# Patient Record
Sex: Female | Born: 1975 | Race: Black or African American | Hispanic: No | Smoking: Former smoker
Health system: Southern US, Community
[De-identification: ages and names within clinical notes are randomized; demographics above are authoritative.]

## PROBLEM LIST (undated history)

## (undated) DIAGNOSIS — N83209 Unspecified ovarian cyst, unspecified side: Secondary | ICD-10-CM

## (undated) DIAGNOSIS — A749 Chlamydial infection, unspecified: Secondary | ICD-10-CM

## (undated) DIAGNOSIS — M1990 Unspecified osteoarthritis, unspecified site: Secondary | ICD-10-CM

## (undated) DIAGNOSIS — A549 Gonococcal infection, unspecified: Secondary | ICD-10-CM

## (undated) DIAGNOSIS — E785 Hyperlipidemia, unspecified: Secondary | ICD-10-CM

## (undated) DIAGNOSIS — R600 Localized edema: Secondary | ICD-10-CM

## (undated) DIAGNOSIS — I10 Essential (primary) hypertension: Secondary | ICD-10-CM

## (undated) HISTORY — DX: Unspecified ovarian cyst, unspecified side: N83.209

## (undated) HISTORY — PX: BREAST SURGERY: SHX581

## (undated) HISTORY — DX: Chlamydial infection, unspecified: A74.9

## (undated) HISTORY — DX: Gonococcal infection, unspecified: A54.9

## (undated) HISTORY — DX: Unspecified osteoarthritis, unspecified site: M19.90

## (undated) HISTORY — DX: Morbid (severe) obesity due to excess calories: E66.01

## (undated) HISTORY — DX: Essential (primary) hypertension: I10

## (undated) HISTORY — DX: Hyperlipidemia, unspecified: E78.5

## (undated) HISTORY — PX: FOOT SURGERY: SHX648

---
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
## 2012-12-01 ENCOUNTER — Encounter: Payer: Self-pay | Admitting: Obstetrics & Gynecology

## 2012-12-01 ENCOUNTER — Ambulatory Visit (INDEPENDENT_AMBULATORY_CARE_PROVIDER_SITE_OTHER): Payer: BC Managed Care – PPO | Admitting: Obstetrics & Gynecology

## 2012-12-01 VITALS — BP 128/84 | HR 90 | Resp 16 | Ht 67.0 in | Wt 303.0 lb

## 2012-12-01 DIAGNOSIS — T8369XA Infection and inflammatory reaction due to other prosthetic device, implant and graft in genital tract, initial encounter: Secondary | ICD-10-CM

## 2012-12-01 DIAGNOSIS — Z30430 Encounter for insertion of intrauterine contraceptive device: Secondary | ICD-10-CM

## 2012-12-01 DIAGNOSIS — Z0000 Encounter for general adult medical examination without abnormal findings: Secondary | ICD-10-CM

## 2012-12-01 DIAGNOSIS — Z01812 Encounter for preprocedural laboratory examination: Secondary | ICD-10-CM

## 2012-12-01 LAB — POCT URINE PREGNANCY: Preg Test, Ur: NEGATIVE

## 2012-12-01 MED ORDER — LEVONORGESTREL 13.5 MG IU IUD
1.0000 | INTRAUTERINE_SYSTEM | Freq: Once | INTRAUTERINE | Status: AC
  Administered 2012-12-01: 1 via INTRAUTERINE

## 2012-12-01 NOTE — Progress Notes (Signed)
  Subjective:    Patient ID: Caitlin Bishop, female    DOB: 07/09/1975, 37 y.o.   MRN: [id number]  HPI  37 yo lady who is planning to get weight loss surgery in the near future. She is here for a Mirena insertion because she wants to ensure contraception for at least 18 months.  Review of Systems She reports a normal pap smear last month.    Objective:   Physical Exam  UPT negative, consent signed, Time out procedure done. Cervix prepped with betadine and grasped with a single tooth tenaculum. Her os was noted to be nulliparous. I attempted to place the Mirena, but it would not pass the os. I therefore used a yellow and then white plastic dilators. They did pass the os, but the Mirena would still not pass the os. I therefore offered her a Skyla. She accepted. Caitlin Bishop was easily placed and the strings were cut to 3-4 cm. Uterus sounded to 8 cm. She tolerated the procedure well.       Assessment & Plan:  Contraception- Skyla RTC 1 month for a string check

## 2013-01-13 ENCOUNTER — Encounter: Payer: Self-pay | Admitting: Obstetrics & Gynecology

## 2013-01-13 ENCOUNTER — Ambulatory Visit (INDEPENDENT_AMBULATORY_CARE_PROVIDER_SITE_OTHER): Payer: BC Managed Care – PPO | Admitting: Obstetrics & Gynecology

## 2013-01-13 VITALS — BP 122/86 | HR 91 | Resp 16 | Ht 67.0 in | Wt 302.0 lb

## 2013-01-13 DIAGNOSIS — Z30431 Encounter for routine checking of intrauterine contraceptive device: Secondary | ICD-10-CM

## 2013-01-13 NOTE — Progress Notes (Signed)
  Subjective:    Patient ID: Caitlin Bishop, female    DOB: 01/15/1976, 37 y.o.   MRN: [id number]  HPI  She is here for a string check after having a Skyla in for the last month. She had a period that was much lighter and much less painful than usual. She is very happy. 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  Review of Systems     Objective:   Physical Exam  String seen      Assessment & Plan:  [name] for contraception RTC 1 year for annual

## 2013-01-13 NOTE — Patient Instructions (Signed)

## 2013-06-07 ENCOUNTER — Encounter: Payer: Self-pay | Admitting: Emergency Medicine

## 2013-06-07 ENCOUNTER — Emergency Department (INDEPENDENT_AMBULATORY_CARE_PROVIDER_SITE_OTHER)
Admission: EM | Admit: 2013-06-07 | Discharge: 2013-06-07 | Disposition: A | Discharge status: Home / Self Care | Payer: BC Managed Care – PPO | Source: Home / Self Care | Attending: Family Medicine | Admitting: Family Medicine

## 2013-06-07 DIAGNOSIS — J029 Acute pharyngitis, unspecified: Secondary | ICD-10-CM

## 2013-06-07 HISTORY — DX: Localized edema: R60.0

## 2013-06-07 LAB — POCT RAPID STREP A (OFFICE): RAPID STREP A SCREEN: NEGATIVE

## 2013-06-07 NOTE — ED Notes (Signed)
Pt c/o sore throat and sinus pressure x last night. Denies fever.

## 2013-06-07 NOTE — ED Provider Notes (Signed)
CSN: [id number]     Arrival date & time 06/07/13  1209 History   First MD Initiated Contact with Patient 06/07/13 1256     Chief Complaint  Patient presents with  . Sore Throat          HPI Comments: Last night patient developed a mild headache, fatigue, sneezing, and sore throat.  This morning she had sinus congestion and post-nasal drainage.  No cough.  No fevers, chills, and sweats   The history is provided by the patient.    Past Medical History  Diagnosis Date  . Arthritis   . Hyperlipidemia   . Hypertension   . Chlamydia   . Gonorrhea   . Other and unspecified ovarian cysts   . Morbid obesity   . Edema of lower extremity    Past Surgical History  Procedure Laterality Date  . Intestional  06/25/77    Hole in intestine  . Foot surgery      removal of sewing  . Breast surgery     Family History  Problem Relation Age of Onset  . Heart attack Brother   . Hypertension Mother   . Dementia Mother   . Parkinson's disease Mother   . Hyperlipidemia Mother   . Hypertension Father   . Hyperlipidemia Father   . Hypertension Brother   . Hyperlipidemia Brother   . Diabetes Brother    History  Substance Use Topics  . Smoking status: Current Every Day Smoker -- 0.50 packs/day for 13 years    Types: Cigarettes  . Smokeless tobacco: Never Used     Comment: Now smoking E-cigarettes  . Alcohol Use: No   OB History   Grav Para Term Preterm Abortions TAB SAB Ect Mult Living   1 0 0  1  1   0     Review of Systems + sore throat No cough No pleuritic pain No wheezing + nasal congestion + post-nasal drainage + sinus pain/pressure No itchy/red eyes No earache No hemoptysis No SOB No fever/chills No nausea No vomiting No abdominal pain No diarrhea No urinary symptoms No skin rash + fatigue No myalgias No headache Used OTC meds without relief    Allergies  Review of patient's allergies indicates no known allergies.  Home Medications   Current  Outpatient Rx  Name  Route  Sig  Dispense  Refill  . cyclobenzaprine (FLEXERIL) 10 MG tablet   Oral   Take 10 mg by mouth at bedtime as needed for muscle spasms.         . diclofenac (VOLTAREN) 75 MG EC tablet   Oral   Take 75 mg by mouth 2 (two) times daily.         . furosemide (LASIX) 20 MG tablet               . phentermine 37.5 MG capsule   Oral   Take 37.5 mg by mouth every morning.         . traMADol-acetaminophen (ULTRACET) 37.5-325 MG per tablet   Oral   Take 1 tablet by mouth every 8 (eight) hours as needed for pain.         . Vitamin D, Ergocalciferol, (DRISDOL) 50000 UNITS CAPS capsule                BP 110/77  Pulse 109  Temp(Src) 98.9 F (37.2 C) (Oral)  Resp 18  Ht 5' 7.5" (1.715 m)  Wt 295 lb (133.811 kg)  BMI 45.49 kg/m2  SpO2 99%  LMP 06/04/2013 Physical Exam Nursing notes and Vital Signs reviewed. Appearance:  Patient appears stated age, and in no acute distress.  Patient is obese (BMI 45.5) Eyes:  Pupils are equal, round, and reactive to light and accomodation.  Extraocular movement is intact.  Conjunctivae are not inflamed  Ears:  Canals normal.  Tympanic membranes normal.  Nose:  Mildly congested turbinates.  No sinus tenderness.   Pharynx:  Normal Neck:  Supple.   Nontender enlarged posterior nodes are palpated bilaterally  Lungs:  Clear to auscultation.  Breath sounds are equal.  Heart:  Regular rate and rhythm without murmurs, rubs, or gallops.  Abdomen:  Nontender without masses or hepatosplenomegaly.  Bowel sounds are present.  No CVA or flank tenderness.  Extremities:  No edema.  No calf tenderness Skin:  No rash present.    ED Course  Procedures  none    Labs Reviewed  STREP A DNA PROBE  POCT RAPID STREP A (OFFICE) negative         MDM   Final diagnoses:  Acute pharyngitis; suspect early viral URI   Throat culture pending There is no evidence of bacterial infection today.  Treat symptomatically for now: If  increasing cold symptoms develop, begin the following: Take plain Mucinex (1200 mg guaifenesin) twice daily for cough and congestion.  May add Sudafed for sinus congestion.   Increase fluid intake, rest. May use Afrin nasal spray (or generic oxymetazoline) twice daily for about 5 days.  Also recommend using saline nasal spray several times daily and saline nasal irrigation (AYR is a common brand) Try warm salt water gargles for sore throat.  Stop all antihistamines for now, and other non-prescription cough/cold preparations. May take Ibuprofen 200mg , 4 tabs every 8 hours with food for sore throat. May take Delsym Cough Suppressant at bedtime for nighttime cough.  Follow-up with family doctor if not improving 7 to 10 days.      Lattie Haw, MD 06/09/13 [phone]

## 2013-06-07 NOTE — Discharge Instructions (Signed)
If increasing cold symptoms develop, begin the following: Take plain Mucinex (1200 mg guaifenesin) twice daily for cough and congestion.  May add Sudafed for sinus congestion.   Increase fluid intake, rest. May use Afrin nasal spray (or generic oxymetazoline) twice daily for about 5 days.  Also recommend using saline nasal spray several times daily and saline nasal irrigation (AYR is a common brand) Try warm salt water gargles for sore throat.  Stop all antihistamines for now, and other non-prescription cough/cold preparations. May take Ibuprofen 200mg , 4 tabs every 8 hours with food for sore throat. May take Delsym Cough Suppressant at bedtime for nighttime cough.  Follow-up with family doctor if not improving 7 to 10 days.

## 2013-06-08 LAB — STREP A DNA PROBE: GASP: NEGATIVE

## 2013-06-11 ENCOUNTER — Telehealth: Payer: Self-pay | Admitting: *Deleted

## 2014-02-14 ENCOUNTER — Encounter: Payer: Self-pay | Admitting: Emergency Medicine

## 2014-12-02 ENCOUNTER — Ambulatory Visit (INDEPENDENT_AMBULATORY_CARE_PROVIDER_SITE_OTHER): Payer: BLUE CROSS/BLUE SHIELD | Admitting: Advanced Practice Midwife

## 2014-12-02 ENCOUNTER — Encounter: Payer: Self-pay | Admitting: Advanced Practice Midwife

## 2014-12-02 VITALS — BP 116/83 | HR 78 | Resp 16 | Ht 67.0 in | Wt 290.0 lb

## 2014-12-02 DIAGNOSIS — N898 Other specified noninflammatory disorders of vagina: Secondary | ICD-10-CM

## 2014-12-02 DIAGNOSIS — E010 Iodine-deficiency related diffuse (endemic) goiter: Secondary | ICD-10-CM

## 2014-12-02 DIAGNOSIS — Z01419 Encounter for gynecological examination (general) (routine) without abnormal findings: Secondary | ICD-10-CM | POA: Diagnosis not present

## 2014-12-02 DIAGNOSIS — Z124 Encounter for screening for malignant neoplasm of cervix: Secondary | ICD-10-CM | POA: Diagnosis not present

## 2014-12-02 DIAGNOSIS — Z1151 Encounter for screening for human papillomavirus (HPV): Secondary | ICD-10-CM

## 2014-12-02 DIAGNOSIS — Z113 Encounter for screening for infections with a predominantly sexual mode of transmission: Secondary | ICD-10-CM

## 2014-12-02 DIAGNOSIS — Z30431 Encounter for routine checking of intrauterine contraceptive device: Secondary | ICD-10-CM

## 2014-12-02 DIAGNOSIS — E049 Nontoxic goiter, unspecified: Secondary | ICD-10-CM

## 2014-12-02 NOTE — Patient Instructions (Signed)
Infertility WHAT IS INFERTILITY?  Infertility is usually defined as not being able to get pregnant after trying for one year of regular sexual intercourse without the use of contraceptives. Or not being able to carry a pregnancy to term and have a baby. The infertility rate in the [place] is around 10%. Pregnancy is the result of a chain of events. A woman must release an egg from one of her ovaries (ovulation). The egg must be fertilized by the female sperm. Then it travels through a fallopian tube into the uterus (womb), where it attaches to the wall of the uterus and grows. A man must have enough sperm, and the sperm must join with (fertilize) the egg along the way, at the proper time. The fertilized egg must then become attached to the inside of the uterus. While this may seem simple, many things can happen to prevent pregnancy from occurring.  WHOSE PROBLEM IS IT?  About 20% of infertility cases are due to problems with the man (female factors) and 65% are due to problems with the woman (female factors). Other cases are due to a combination of female and female factors or to unknown causes.  WHAT CAUSES INFERTILITY IN MEN?  Infertility in men is often caused by problems with making enough normal sperm or getting the sperm to reach the egg. Problems with sperm may exist from birth or develop later in life, due to illness or injury. Some men produce no sperm, or produce too few sperm (oligospermia). Other problems include:  Sexual dysfunction.  Hormonal or endocrine problems.  Age. Female fertility decreases with age, but not at as young an age as female fertility.  Infection.  Congenital problems. Birth defect, such as absence of the tubes that carry the sperm (vas deferens).  Genetic/chromosomal problems.  Antisperm antibody problems.  Retrograde ejaculation (sperm go into the bladder).  Varicoceles, spematoceles, or tumors of the testicles.  Lifestyle can influence the number and  quality of a man's sperm.  Alcohol and drugs can temporarily reduce sperm quality.  Environmental toxins, including pesticides and lead, may cause some cases of infertility in men. WHAT CAUSES INFERTILITY IN WOMEN?   Problems with ovulation account for most infertility in women. Without ovulation, eggs are not available to be fertilized.  Signs of problems with ovulation include irregular menstrual periods or no periods at all.  Simple lifestyle factors, including stress, diet, or athletic training, can affect a woman's hormonal balance.  Age. Fertility begins to decrease in women in the early 76s and is worse after age 71.  Much less often, a hormonal imbalance from a serious medical problem, such as a pituitary gland tumor, thyroid or other chronic medical disease, can cause ovulation problems.  Pelvic infections.  Polycystic ovary syndrome (increase in female hormones, unable to ovulate).  Alcohol or illegal drugs.  Environmental toxins, radiation, pesticides, and certain chemicals.  Aging is an important factor in female infertility.  The ability of a woman's ovaries to produce eggs declines with age, especially after age 27. About one third of couples where the woman is over 21 will have problems with fertility.  By the time she reaches menopause when her monthly periods stop for good, a woman can no longer produce eggs or become pregnant.  Other problems can also lead to infertility in women. If the fallopian tubes are blocked at one or both ends, the egg cannot travel through the tubes into the uterus. Scar tissue (adhesions) in the pelvis may cause blocked  tubes. This may result from pelvic inflammatory disease, endometriosis, or surgery for an ectopic pregnancy (fertilized egg implanted outside the uterus) or any pelvic or abdominal surgery causing adhesions.  Fibroid tumors or polyps of the uterus.  Congenital (birth defect) abnormalities of the uterus.  Infection of the  cervix (cervicitis).  Cervical stenosis (narrowing).  Abnormal cervical mucus.  Polycystic ovary syndrome.  Having sexual intercourse too often (every other day or 4 to 5 times a week).  Obesity.  Anorexia.  Poor nutrition.  Over exercising, with loss of body fat.  DES. Your mother received diethylstilbesterol hormone when pregnant with you. HOW IS INFERTILITY TESTED?  If you have been trying to have a baby without success, you may want to seek medical help. You should not wait for one year of trying before seeing a health care provider if:  You are over 35.  You have reason to believe that there may be a fertility problem. A medical evaluation may determine the reasons for a couple's infertility. Usually this process begins with:  Physical exams.  Medical histories of both partners.  Sexual histories of both partners. If there is no obvious problem, like improperly timed intercourse or absence of ovulation, tests may be needed.   For a man, testing usually begins with tests of his semen to look at:  The number of sperm.  The shape of sperm.  Movement of his sperm.  Taking a complete medical and surgical history.  Physical examination.  Check for infection of the female reproductive organs. Sometimes hormone tests are done.   For a woman, the first step in testing is to find out if she is ovulating each month. There are several ways to do this. For example, she can keep track of changes in her morning body temperature and in the texture of her cervical mucus. Another tool is a home ovulation test kit, which can be bought at drug or grocery stores.  Checks of ovulation can also be done in the doctor's office, using blood tests for hormone levels or ultrasound tests of the ovaries. If the woman is ovulating, more tests will need to be done. Some common female tests include:  Hysterosalpingogram: An x-ray of the fallopian tubes and uterus after they are injected with  dye. It shows if the tubes are open and shows the shape of the uterus.  Laparoscopy: An exam of the tubes and other female organs for disease. A lighted tube called a laparoscope is used to see inside the abdomen.  Endometrial biopsy: Sample of uterus tissue taken on the first day of the menstrual period, to see if the tissue indicates you are ovulating.  Transvaginal ultrasound: Examines the female organs.  Hysteroscopy: Uses a lighted tube to examine the cervix and inside the uterus, to see if there are any abnormalities inside the uterus. TREATMENT  Depending on the test results, different treatments can be suggested. The type of treatment depends on the cause. 85 to 90% of infertility cases are treated with drugs or surgery.   Various fertility drugs may be used for women with ovulation problems. It is important to talk with your caregiver about the drug to be used. You should understand the drug's benefits and side effects. Depending on the type of fertility drug and the dosage of the drug used, multiple births (twins or multiples) can occur in some women.  If needed, surgery can be done to repair damage to a woman's ovaries, fallopian tubes, cervix, or uterus.  Surgery  or medical treatment for endometriosis or polycystic ovary syndrome. Sometimes a man has an infertility problem that can be corrected with medicine or by surgery.  Intrauterine insemination (IUI) of sperm, timed with ovulation.  Change in lifestyle, if that is the cause (lose weight, increase exercise, and stop smoking, drinking excessively, or taking illegal drugs).  Other types of surgery:  Removing growths inside and on the uterus.  Removing scar tissue from inside of the uterus.  Fixing blocked tubes.  Removing scar tissue in the pelvis and around the female organs. WHAT IS ASSISTED REPRODUCTIVE TECHNOLOGY (ART)?  Assisted reproductive technology (ART) is another form of special methods used to help infertile  couples. ART involves handling both the woman's eggs and the man's sperm. Success rates vary and depend on many factors. ART can be expensive and time-consuming. But ART has made it possible for many couples to have children that otherwise would not have been conceived. Some methods are listed below:  In vitro fertilization (IVF). IVF is often used when a woman's fallopian tubes are blocked or when a man has low sperm counts. A drug is used to stimulate the ovaries to produce multiple eggs. Once mature, the eggs are removed and placed in a culture dish with the man's sperm for fertilization. After about 40 hours, the eggs are examined to see if they have become fertilized by the sperm and are dividing into cells. These fertilized eggs (embryos) are then placed in the woman's uterus. This bypasses the fallopian tubes.  Gamete intrafallopian transfer (GIFT) is similar to IVF, but used when the woman has at least one normal fallopian tube. Three to five eggs are placed in the fallopian tube, along with the man's sperm, for fertilization inside the woman's body.  Zygote intrafallopian transfer (ZIFT), also called tubal embryo transfer, combines IVF and GIFT. The eggs retrieved from the woman's ovaries are fertilized in the lab and placed in the fallopian tubes rather than in the uterus.  ART procedures sometimes involve the use of donor eggs (eggs from another woman) or previously frozen embryos. Donor eggs may be used if a woman has impaired ovaries or has a genetic disease that could be passed on to her baby.  When performing ART, you are at higher risk for resulting in multiple pregnancies, twins, triplets or more.  Intracytoplasma sperm injection is a procedure that injects a single sperm into the egg to fertilize it.  Embryo transplant is a procedure that starts after growing an embryo in a special media (chemical solution) developed to keep the embryo alive for 2 to 5 days, and then transplanting it  into the uterus. In cases where a cause cannot be found and pregnancy does not occur, adoption may be a consideration. Document Released: 04/04/2003 Document Revised: 06/24/2011 Document Reviewed: 02/28/2009 [facility] Patient Information 2015 [place], [place]. This information is not intended to replace advice given to you by your health care provider. Make sure you discuss any questions you have with your health care provider.

## 2014-12-03 LAB — HEPATITIS C ANTIBODY: HCV Ab: NEGATIVE

## 2014-12-03 LAB — WET PREP BY MOLECULAR PROBE
Candida species: NEGATIVE
GARDNERELLA VAGINALIS: POSITIVE — AB
Trichomonas vaginosis: NEGATIVE

## 2014-12-03 LAB — TSH: TSH: 1.814 u[IU]/mL (ref 0.350–4.500)

## 2014-12-03 LAB — HEPATITIS B SURFACE ANTIGEN: Hepatitis B Surface Ag: NEGATIVE

## 2014-12-03 LAB — HIV ANTIBODY (ROUTINE TESTING W REFLEX): HIV: NONREACTIVE

## 2014-12-03 LAB — GC/CHLAMYDIA PROBE AMP
CT PROBE, AMP APTIMA: NEGATIVE
GC PROBE AMP APTIMA: NEGATIVE

## 2014-12-03 LAB — RPR

## 2014-12-05 ENCOUNTER — Telehealth: Payer: Self-pay | Admitting: *Deleted

## 2014-12-05 DIAGNOSIS — N760 Acute vaginitis: Principal | ICD-10-CM

## 2014-12-05 DIAGNOSIS — B9689 Other specified bacterial agents as the cause of diseases classified elsewhere: Secondary | ICD-10-CM

## 2014-12-05 LAB — CYTOLOGY - PAP

## 2014-12-05 MED ORDER — METRONIDAZOLE 500 MG PO TABS: 500.0000 mg | ORAL_TABLET | Freq: Two times a day (BID) | ORAL | Status: DC

## 2014-12-05 NOTE — Telephone Encounter (Signed)
Pt notified of normal labwork and the positive BV.  RX for Flagyl sent to [facility] per protocol.

## 2014-12-12 NOTE — Progress Notes (Signed)
Subjective:     Caitlin Bishop is a 39 y.o. female here for a routine exam.  Current complaints: vaginal discharge. Requesting STD testing. Considering TTC in next year. Only conceived once (resultiing in SAB) during many years of no contraception when married in 20's. Personal health questionnaire reviewed: yes.   Gynecologic History Patient's last menstrual period was 12/02/2014. Contraception: IUD Last Pap: 2014. Results were: normal Last mammogram: None. Results were: NA  Obstetric History OB History  Gravida Para Term Preterm AB SAB TAB Ectopic Multiple Living  1 0 0  1 1    0    # Outcome Date GA Lbr Len/2nd Weight Sex Delivery Anes PTL Lv  1 SAB                The following portions of the patient's history were reviewed and updated as appropriate: allergies, current medications, past family history, past medical history, past social history, past surgical history and problem list.  Review of Systems  Review of Systems  Constitutional: Negative for fever and chills.  Gastrointestinal: Negative for abdominal pain.  Genitourinary:       Pos for vaginal discharge. Neg for vaginal bleeding, intermenstrual bleeding, dyspareunia, vaginal itching, vaginal odor.    Objective:    BP 116/83 mmHg  Pulse 78  Resp 16  Ht  (1.702 m)  Wt 290 lb (131.543 kg)  BMI 45.41 kg/m2  LMP 12/02/2014 General appearance: alert, cooperative, no distress and morbidly obese Head: Normocephalic, without obvious abnormality, atraumatic Neck: no adenopathy and thyroid: mildly enlarged. No palpable nodules Back: No CVAT Lungs: clear to auscultation bilaterally Breasts: No nipple retraction or dimpling, No axillary or supraclavicular adenopathy, Taught monthly breast self examination, Well-healed breast reduction scars bilat. No masses or tenderness Heart: regular rate and rhythm, S1, S2 normal, no murmur, click, rub or gallop Abdomen: soft, non-tender; bowel sounds normal; no masses,  no  organomegaly and obese Pelvic: cervix normal in appearance, external genitalia normal, no adnexal masses or tenderness, no cervical motion tenderness, uterus normal size, shape, and consistency, vagina normal without discharge and exam limited by body habitus Extremities: no edema, redness or tenderness in the calves or thighs Neurologic: Alert and oriented X 3, normal strength and tone. Normal symmetric reflexes. Normal coordination and gait    Assessment:    Healthy female exam.   1. Well woman exam  - GC/Chlamydia Probe Amp - HIV antibody (with reflex) - RPR - Hepatitis B Surface AntiGEN - Hepatitis C Antibody - Cytology - PAP  2. Vaginal discharge  - WET PREP BY MOLECULAR PROBE  3. Thyromegaly  - TSH  4. Considering trying to conceive  - Preconception counseling.  - Discussed importance of getting chronic medical conditions and weight as normal as possible prior to TTC.  - If unable to conceive after 6 months of trying 2-3 x per week recommend infertility specialist.

## 2015-04-05 ENCOUNTER — Ambulatory Visit (INDEPENDENT_AMBULATORY_CARE_PROVIDER_SITE_OTHER): Payer: BLUE CROSS/BLUE SHIELD | Admitting: Family Medicine

## 2015-04-05 VITALS — BP 116/80 | HR 98 | Temp 98.3°F | Resp 20 | Ht 68.0 in | Wt 297.6 lb

## 2015-04-05 DIAGNOSIS — T24202A Burn of second degree of unspecified site of left lower limb, except ankle and foot, initial encounter: Secondary | ICD-10-CM

## 2015-04-05 DIAGNOSIS — M79672 Pain in left foot: Secondary | ICD-10-CM

## 2015-04-05 MED ORDER — MUPIROCIN 2 % EX OINT: 1.0000 "application " | TOPICAL_OINTMENT | Freq: Two times a day (BID) | CUTANEOUS | Status: AC

## 2015-04-05 MED ORDER — CEPHALEXIN 500 MG PO CAPS: 500.0000 mg | ORAL_CAPSULE | Freq: Two times a day (BID) | ORAL | Status: AC

## 2015-04-05 NOTE — Patient Instructions (Signed)
Gently wash the burn areas every day with soap and water.   apply Bactroban ointment to the left foot twice a day and cover with a Telfa gauze and Kuban elastic.  Take the Keflex twice a day.  Please return in 2-3 days if the foot pain is not significantly better
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
## 2015-06-16 ENCOUNTER — Emergency Department (HOSPITAL_COMMUNITY)
Admission: EM | Admit: 2015-06-16 | Discharge: 2015-06-16 | Disposition: A | Discharge status: Home / Self Care | Payer: BLUE CROSS/BLUE SHIELD | Attending: Emergency Medicine | Admitting: Emergency Medicine

## 2015-06-16 ENCOUNTER — Encounter (HOSPITAL_COMMUNITY): Payer: Self-pay

## 2015-06-16 DIAGNOSIS — S3992XA Unspecified injury of lower back, initial encounter: Secondary | ICD-10-CM | POA: Diagnosis not present

## 2015-06-16 DIAGNOSIS — Z792 Long term (current) use of antibiotics: Secondary | ICD-10-CM | POA: Insufficient documentation

## 2015-06-16 DIAGNOSIS — Y92410 Unspecified street and highway as the place of occurrence of the external cause: Secondary | ICD-10-CM | POA: Insufficient documentation

## 2015-06-16 DIAGNOSIS — Z8619 Personal history of other infectious and parasitic diseases: Secondary | ICD-10-CM | POA: Insufficient documentation

## 2015-06-16 DIAGNOSIS — F17210 Nicotine dependence, cigarettes, uncomplicated: Secondary | ICD-10-CM | POA: Diagnosis not present

## 2015-06-16 DIAGNOSIS — I10 Essential (primary) hypertension: Secondary | ICD-10-CM | POA: Diagnosis not present

## 2015-06-16 DIAGNOSIS — Z8739 Personal history of other diseases of the musculoskeletal system and connective tissue: Secondary | ICD-10-CM | POA: Diagnosis not present

## 2015-06-16 DIAGNOSIS — Z8742 Personal history of other diseases of the female genital tract: Secondary | ICD-10-CM | POA: Diagnosis not present

## 2015-06-16 DIAGNOSIS — S199XXA Unspecified injury of neck, initial encounter: Secondary | ICD-10-CM | POA: Insufficient documentation

## 2015-06-16 DIAGNOSIS — Y999 Unspecified external cause status: Secondary | ICD-10-CM | POA: Diagnosis not present

## 2015-06-16 DIAGNOSIS — Y9389 Activity, other specified: Secondary | ICD-10-CM | POA: Insufficient documentation

## 2015-06-16 DIAGNOSIS — M542 Cervicalgia: Secondary | ICD-10-CM

## 2015-06-16 MED ORDER — IBUPROFEN 800 MG PO TABS: 800.0000 mg | ORAL_TABLET | Freq: Three times a day (TID) | ORAL | Status: AC

## 2015-06-16 MED ORDER — METHOCARBAMOL 500 MG PO TABS: 500.0000 mg | ORAL_TABLET | Freq: Two times a day (BID) | ORAL | Status: AC
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
## 2015-06-16 NOTE — Discharge Instructions (Signed)
1. Medications: Take ibuprofen as directed for the next 3 days then as needed for pain, Robaxin as her muscle relaxer take only as needed - This can make you very drowsy - please do not drink or drive on this medication, continue usual home medications 2. Treatment: rest, drink plenty of fluids, ice affected area 3. Follow Up: Please follow up with your primary doctor in 3-5 days if no improvement for discussion of your diagnoses and further evaluation after today's visit; Please return to the ER for new or worsening symptoms, any additional concerns.  COLD THERAPY DIRECTIONS:  Ice or gel packs can be used to reduce both pain and swelling. Ice is the most helpful within the first 24 to 48 hours after an injury or flareup from overusing a muscle or joint.  Ice is effective, has very few side effects, and is safe for most people to use.   If you expose your skin to cold temperatures for too long or without the proper protection, you can damage your skin or nerves. Watch for signs of skin damage due to cold.   HOME CARE INSTRUCTIONS  Follow these tips to use ice and cold packs safely.  Place a dry or damp towel between the ice and skin. A damp towel will cool the skin more quickly, so you may need to shorten the time that the ice is used.  For a more rapid response, add gentle compression to the ice.  Ice for no more than 10 to 20 minutes at a time. The bonier the area you are icing, the less time it will take to get the benefits of ice.  Check your skin after 5 minutes to make sure there are no signs of a poor response to cold or skin damage.  Rest 20 minutes or more in between uses.  Once your skin is numb, you can end your treatment. You can test numbness by very lightly touching your skin. The touch should be so light that you do not see the skin dimple from the pressure of your fingertip. When using ice, most people will feel these normal sensations in this order: cold, burning, aching, and  numbness.

## 2015-06-16 NOTE — ED Notes (Signed)
Pt A&Ox4, ambulatory at d/c with steady gait, NAD 

## 2015-07-05 ENCOUNTER — Emergency Department (HOSPITAL_COMMUNITY): Payer: BLUE CROSS/BLUE SHIELD

## 2015-07-05 ENCOUNTER — Emergency Department (HOSPITAL_COMMUNITY)
Admission: EM | Admit: 2015-07-05 | Discharge: 2015-07-06 | Disposition: A | Discharge status: Home / Self Care | Payer: BLUE CROSS/BLUE SHIELD | Attending: Emergency Medicine | Admitting: Emergency Medicine

## 2015-07-05 ENCOUNTER — Encounter (HOSPITAL_COMMUNITY): Payer: Self-pay

## 2015-07-05 DIAGNOSIS — E785 Hyperlipidemia, unspecified: Secondary | ICD-10-CM | POA: Diagnosis not present

## 2015-07-05 DIAGNOSIS — F17210 Nicotine dependence, cigarettes, uncomplicated: Secondary | ICD-10-CM | POA: Diagnosis not present

## 2015-07-05 DIAGNOSIS — R079 Chest pain, unspecified: Secondary | ICD-10-CM | POA: Diagnosis not present

## 2015-07-05 DIAGNOSIS — Z8619 Personal history of other infectious and parasitic diseases: Secondary | ICD-10-CM | POA: Diagnosis not present

## 2015-07-05 DIAGNOSIS — Z79899 Other long term (current) drug therapy: Secondary | ICD-10-CM | POA: Diagnosis not present

## 2015-07-05 DIAGNOSIS — I10 Essential (primary) hypertension: Secondary | ICD-10-CM | POA: Diagnosis not present

## 2015-07-05 LAB — I-STAT TROPONIN, ED
TROPONIN I, POC: 0 ng/mL (ref 0.00–0.08)
Troponin i, poc: 0 ng/mL (ref 0.00–0.08)

## 2015-07-05 LAB — BASIC METABOLIC PANEL
ANION GAP: 12 (ref 5–15)
BUN: 8 mg/dL (ref 6–20)
CHLORIDE: 106 mmol/L (ref 101–111)
CO2: 22 mmol/L (ref 22–32)
Calcium: 8.9 mg/dL (ref 8.9–10.3)
Creatinine, Ser: 1.17 mg/dL — ABNORMAL HIGH (ref 0.44–1.00)
GFR calc non Af Amer: 58 mL/min — ABNORMAL LOW (ref >60)
GLUCOSE: 162 mg/dL — AB (ref 65–99)
POTASSIUM: 4.1 mmol/L (ref 3.5–5.1)
Sodium: 140 mmol/L (ref 135–145)

## 2015-07-05 LAB — CBC
HEMATOCRIT: 46 % (ref 36.0–46.0)
HEMOGLOBIN: 15.4 g/dL — AB (ref 12.0–15.0)
MCH: 29.6 pg (ref 26.0–34.0)
MCHC: 33.5 g/dL (ref 30.0–36.0)
MCV: 88.5 fL (ref 78.0–100.0)
Platelets: 258 10*3/uL (ref 150–400)
RBC: 5.2 MIL/uL — AB (ref 3.87–5.11)
RDW: 12.5 % (ref 11.5–15.5)
WBC: 7.5 10*3/uL (ref 4.0–10.5)

## 2015-07-05 MED ORDER — SODIUM CHLORIDE 0.9 % IV SOLN
INTRAVENOUS | Status: DC
  Administered 2015-07-05: 22:00:00 via INTRAVENOUS

## 2015-07-05 MED ORDER — MORPHINE SULFATE (PF) 4 MG/ML IV SOLN
4.0000 mg | INTRAVENOUS | Status: DC | PRN
  Administered 2015-07-05: 4 mg via INTRAVENOUS
  Filled 2015-07-05: qty 1

## 2015-07-05 NOTE — Discharge Instructions (Signed)
Nonspecific Chest Pain  °Chest pain can be caused by many different conditions. There is always a chance that your pain could be related to something serious, such as a heart attack or a blood clot in your lungs. Chest pain can also be caused by conditions that are not life-threatening. If you have chest pain, it is very important to follow up with your health care provider. °CAUSES  °Chest pain can be caused by: °· Heartburn. °· Pneumonia or bronchitis. °· Anxiety or stress. °· Inflammation around your heart (pericarditis) or lung (pleuritis or pleurisy). °· A blood clot in your lung. °· A collapsed lung (pneumothorax). It can develop suddenly on its own (spontaneous pneumothorax) or from trauma to the chest. °· Shingles infection (varicella-zoster virus). °· Heart attack. °· Damage to the bones, muscles, and cartilage that make up your chest wall. This can include: °¨ Bruised bones due to injury. °¨ Strained muscles or cartilage due to frequent or repeated coughing or overwork. °¨ Fracture to one or more ribs. °¨ Sore cartilage due to inflammation (costochondritis). °RISK FACTORS  °Risk factors for chest pain may include: °· Activities that increase your risk for trauma or injury to your chest. °· Respiratory infections or conditions that cause frequent coughing. °· Medical conditions or overeating that can cause heartburn. °· Heart disease or family history of heart disease. °· Conditions or health behaviors that increase your risk of developing a blood clot. °· Having had chicken pox (varicella zoster). °SIGNS AND SYMPTOMS °Chest pain can feel like: °· Burning or tingling on the surface of your chest or deep in your chest. °· Crushing, pressure, aching, or squeezing pain. °· Dull or sharp pain that is worse when you move, cough, or take a deep breath. °· Pain that is also felt in your back, neck, shoulder, or arm, or pain that spreads to any of these areas. °Your chest pain may come and go, or it may stay  constant. °DIAGNOSIS °Lab tests or other studies may be needed to find the cause of your pain. Your health care provider may have you take a test called an ambulatory ECG (electrocardiogram). An ECG records your heartbeat patterns at the time the test is performed. You may also have other tests, such as: °· Transthoracic echocardiogram (TTE). During echocardiography, sound waves are used to create a picture of all of the heart structures and to look at how blood flows through your heart. °· Transesophageal echocardiogram (TEE). This is a more advanced imaging test that obtains images from inside your body. It allows your health care provider to see your heart in finer detail. °· Cardiac monitoring. This allows your health care provider to monitor your heart rate and rhythm in real time. °· Holter monitor. This is a portable device that records your heartbeat and can help to diagnose abnormal heartbeats. It allows your health care provider to track your heart activity for several days, if needed. °· Stress tests. These can be done through exercise or by taking medicine that makes your heart beat more quickly. °· Blood tests. °· Imaging tests. °TREATMENT  °Your treatment depends on what is causing your chest pain. Treatment may include: °· Medicines. These may include: °¨ Acid blockers for heartburn. °¨ Anti-inflammatory medicine. °¨ Pain medicine for inflammatory conditions. °¨ Antibiotic medicine, if an infection is present. °¨ Medicines to dissolve blood clots. °¨ Medicines to treat coronary artery disease. °· Supportive care for conditions that do not require medicines. This may include: °¨ Resting. °¨ Applying heat   or cold packs to injured areas. °¨ Limiting activities until pain decreases. °HOME CARE INSTRUCTIONS °· If you were prescribed an antibiotic medicine, finish it all even if you start to feel better. °· Avoid any activities that bring on chest pain. °· Do not use any tobacco products, including  cigarettes, chewing tobacco, or electronic cigarettes. If you need help quitting, ask your health care provider. °· Do not drink alcohol. °· Take medicines only as directed by your health care provider. °· Keep all follow-up visits as directed by your health care provider. This is important. This includes any further testing if your chest pain does not go away. °· If heartburn is the cause for your chest pain, you may be told to keep your head raised (elevated) while sleeping. This reduces the chance that acid will go from your stomach into your esophagus. °· Make lifestyle changes as directed by your health care provider. These may include: °¨ Getting regular exercise. Ask your health care provider to suggest some activities that are safe for you. °¨ Eating a heart-healthy diet. A registered dietitian can help you to learn healthy eating options. °¨ Maintaining a healthy weight. °¨ Managing diabetes, if necessary. °¨ Reducing stress. °SEEK MEDICAL CARE IF: °· Your chest pain does not go away after treatment. °· You have a rash with blisters on your chest. °· You have a fever. °SEEK IMMEDIATE MEDICAL CARE IF:  °· Your chest pain is worse. °· You have an increasing cough, or you cough up blood. °· You have severe abdominal pain. °· You have severe weakness. °· You faint. °· You have chills. °· You have sudden, unexplained chest discomfort. °· You have sudden, unexplained discomfort in your arms, back, neck, or jaw. °· You have shortness of breath at any time. °· You suddenly start to sweat, or your skin gets clammy. °· You feel nauseous or you vomit. °· You suddenly feel light-headed or dizzy. °· Your heart begins to beat quickly, or it feels like it is skipping beats. °These symptoms may represent a serious problem that is an emergency. Do not wait to see if the symptoms will go away. Get medical help right away. Call your local emergency services (911 in the [place] Do not drive yourself to the hospital. °  °This  information is not intended to replace advice given to you by your health care provider. Make sure you discuss any questions you have with your health care provider. °  °Document Released: 01/09/2005 Document Revised: 04/22/2014 Document Reviewed: 11/05/2013 °Elsevier Interactive Patient Education ©2016 [employer] ° °

## 2015-07-05 NOTE — ED Notes (Signed)
Per [facility]: left sided chest pain, started with sudden onset around 7am today, aching pain, raiding down left arm and epigastric area. No nausea or vomiting, no syncope, no sweating. Pt given 324 ASA.

## 2015-07-05 NOTE — ED Notes (Signed)
Pain 9/10. Pt did not receive any nitro.

## 2015-07-05 NOTE — ED Provider Notes (Signed)
CSN: [id number]     Arrival date & time 07/05/15  2033 History   First MD Initiated Contact with Patient 07/05/15 2047     Chief Complaint  Patient presents with  . Chest Pain     HPI Pt noticed it at work around 8 am.  She felt a tightness on the left side of her chest.  After about 7 minutes it resolved.  She started eating breakfast and it started again.    It started again around 6:30 pm and has been constant since then.  The pain goes into her left arm.  Some nausea, no shortness of breath.  No diaphoresis.  No hx of heart or lung disease.  Father and brother had MI.  Past Medical History  Diagnosis Date  . Arthritis   . Hyperlipidemia   . Hypertension   . Chlamydia   . Gonorrhea   . Other and unspecified ovarian cysts   . Morbid obesity (HCC)   . Edema of lower extremity    Past Surgical History  Procedure Laterality Date  . Intestional  06/25/77    Hole in intestine  . Foot surgery      removal of sewing  . Breast surgery     Family History  Problem Relation Age of Onset  . Heart attack Brother   . Hypertension Mother   . Dementia Mother   . Parkinson's disease Mother   . Hyperlipidemia Mother   . Hypertension Father   . Hyperlipidemia Father   . Hypertension Brother   . Hyperlipidemia Brother   . Diabetes Brother    Social History  Substance Use Topics  . Smoking status: Current Every Day Smoker -- 0.50 packs/day for 13 years    Types: Cigarettes  . Smokeless tobacco: Never Used     Comment: Now smoking E-cigarettes  . Alcohol Use: No   OB History    Gravida Para Term Preterm AB TAB SAB Ectopic Multiple Living   1 0 0  1  1   0     Review of Systems  All other systems reviewed and are negative.     Allergies  Review of patient's allergies indicates no known allergies.  Home Medications   Prior to Admission medications   Medication Sig Start Date End Date Taking? Authorizing Provider  ibuprofen (ADVIL,MOTRIN) 200 MG tablet Take 400 mg by  mouth every 6 (six) hours as needed for moderate pain.   Yes Historical Provider, MD  ibuprofen (ADVIL,MOTRIN) 800 MG tablet Take 1 tablet (800 mg total) by mouth 3 (three) times daily. Patient taking differently: Take 800 mg by mouth every 8 (eight) hours as needed for moderate pain.  06/16/15  Yes Jaime Pilcher Ward, PA-C  methocarbamol (ROBAXIN) 500 MG tablet Take 1 tablet (500 mg total) by mouth 2 (two) times daily. Patient taking differently: Take 500 mg by mouth every 8 (eight) hours as needed.  06/16/15  Yes Jaime Pilcher Ward, PA-C  Omega-3 Fatty Acids (FISH OIL) 1000 MG CAPS Take 1,000 mg by mouth daily as needed (takes occasionally).   Yes Historical Provider, MD  cephALEXin (KEFLEX) 500 MG capsule Take 1 capsule (500 mg total) by mouth 2 (two) times daily. 04/05/15   Elvina SidleKurt Lauenstein, MD  mupirocin ointment (BACTROBAN) 2 % Place 1 application into the nose 2 (two) times daily. 04/05/15   Elvina SidleKurt Lauenstein, MD   BP 113/61 mmHg  Pulse 69  Temp(Src) 98.8 F (37.1 C) (Oral)  Resp 18  Ht 5'  7" (1.702 m)  Wt 136.079 kg  BMI 46.98 kg/m2  SpO2 100% Physical Exam  Constitutional: She appears well-developed and well-nourished. No distress.  HENT:  Head: Normocephalic and atraumatic.  Right Ear: External ear normal.  Left Ear: External ear normal.  Eyes: Conjunctivae are normal. Right eye exhibits no discharge. Left eye exhibits no discharge. No scleral icterus.  Neck: Neck supple. No tracheal deviation present.  Cardiovascular: Normal rate, regular rhythm and intact distal pulses.   Pulmonary/Chest: Effort normal and breath sounds normal. No stridor. No respiratory distress. She has no wheezes. She has no rales.  Abdominal: Soft. Bowel sounds are normal. She exhibits no distension. There is no tenderness. There is no rebound and no guarding.  Musculoskeletal: She exhibits edema. She exhibits no tenderness.  Neurological: She is alert. She has normal strength. No cranial nerve deficit (no  facial droop, extraocular movements intact, no slurred speech) or sensory deficit. She exhibits normal muscle tone. She displays no seizure activity. Coordination normal.  Skin: Skin is warm and dry. No rash noted.  Psychiatric: She has a normal mood and affect.  Nursing note and vitals reviewed.   ED Course  Procedures (including critical care time) Labs Review Labs Reviewed  BASIC METABOLIC PANEL - Abnormal; Notable for the following:    Glucose, Bld 162 (*)    Creatinine, Ser 1.17 (*)    GFR calc non Af Amer 58 (*)    All other components within normal limits  CBC - Abnormal; Notable for the following:    RBC 5.20 (*)    Hemoglobin 15.4 (*)    All other components within normal limits  I-STAT TROPOININ, ED  I-STAT TROPOININ, ED  Rosezena Sensor, ED    Imaging Review Dg Chest 2 View  07/05/2015  CLINICAL DATA:  Left-sided chest pain for 1 day EXAM: CHEST  2 VIEW COMPARISON:  None. FINDINGS: Lungs are clear. Heart size and pulmonary vascularity are normal. No adenopathy. No pneumothorax. No bone lesions. IMPRESSION: No abnormality noted. Electronically Signed   By: Bretta Bang III M.D.   On: 07/05/2015 21:32   I have personally reviewed and evaluated these images and lab results as part of my medical decision-making.   EKG Interpretation   Date/Time:  Wednesday July 05 2015 20:41:46 EDT Ventricular Rate:  75 PR Interval:  166 QRS Duration: 99 QT Interval:  385 QTC Calculation: 430 R Axis:   93 Text Interpretation:  Sinus rhythm Borderline right axis deviation No old  tracing to compare Confirmed by Klair Leising  MD-J, Alis Sawchuk ([id number]) on 07/05/2015  8:47:36 PM      MDM   Final diagnoses:  Chest pain with low risk for cardiac etiology    The patient does have cardiac risk factors of hypertension, hyperlipidemia and family history of heart disease. Counseling on smoking cessation. Her EKG is reassuring. She's had 2 sets of cardiac markers that are normal. It's possible  the symptoms may be related to a gastroesophageal etiology considering the symptoms started after eating. At this point she is feeling better. I think she is low risk for acute cardiac event. We discussed close outpatient follow-up.    Linwood Dibbles, MD 07/05/15 [phone]

## 2015-07-05 NOTE — ED Notes (Signed)
Patient transported to X-ray 

## 2015-07-06 NOTE — ED Notes (Signed)
Pt departed in NAD.  

## 2015-07-29 ENCOUNTER — Ambulatory Visit (INDEPENDENT_AMBULATORY_CARE_PROVIDER_SITE_OTHER): Payer: BLUE CROSS/BLUE SHIELD | Admitting: Physician Assistant

## 2015-07-29 VITALS — BP 118/80 | HR 84 | Temp 98.7°F | Resp 18 | Ht 68.0 in | Wt 306.2 lb

## 2015-07-29 DIAGNOSIS — J302 Other seasonal allergic rhinitis: Secondary | ICD-10-CM

## 2015-07-29 DIAGNOSIS — R070 Pain in throat: Secondary | ICD-10-CM | POA: Diagnosis not present

## 2015-07-29 MED ORDER — MAGIC MOUTHWASH W/LIDOCAINE: 10.0000 mL | ORAL | Status: DC | PRN

## 2015-07-29 MED ORDER — CETIRIZINE HCL 10 MG PO TABS: 10.0000 mg | ORAL_TABLET | Freq: Every day | ORAL | Status: AC

## 2015-07-29 MED ORDER — FLUTICASONE PROPIONATE 50 MCG/ACT NA SUSP: 2.0000 | Freq: Every day | NASAL | Status: AC

## 2015-07-29 MED ORDER — MAGIC MOUTHWASH W/LIDOCAINE: 5.0000 mL | Freq: Four times a day (QID) | ORAL | Status: AC | PRN

## 2015-07-29 NOTE — Progress Notes (Signed)
[facility] [address], [place] [address] [phone]  Date:  07/29/2015   Name:  Caitlin CancelBarbara Bishop   DOB:  June 11, 1975   MRN:  [id number]  PCP:  Primus BravoGIBSON,TIFFANY, NP    History of Present Illness:  Caitlin Bishop is a 40 y.o. female patient smoker who [facility] throat pain, and hoarseness.  2 days ago with scratchy throat.  She attempted chamomile tea and honey, which helped some.  She went out, and now has developed hoarseness.  Hurts to swallow, eat, or try to talk.  She has no fever.  She had a headache.  Minimal coughing, no congestion.  She is sneezing.  She has watery eyes.  5-6 cigarettes. She took an allergy med which did not help (does not recall the name).  No sob or dyspnea.  No productive cough.       There are no active problems to display for this patient.   Past Medical History  Diagnosis Date  . Arthritis   . Hyperlipidemia   . Hypertension   . Chlamydia   . Gonorrhea   . Other and unspecified ovarian cysts   . Morbid obesity (HCC)   . Edema of lower extremity     Past Surgical History  Procedure Laterality Date  . Intestional  06/25/77    Hole in intestine  . Foot surgery      removal of sewing  . Breast surgery      Social History  Substance Use Topics  . Smoking status: Current Every Day Smoker -- 0.50 packs/day for 13 years    Types: Cigarettes  . Smokeless tobacco: Never Used     Comment: Now smoking E-cigarettes  . Alcohol Use: No    Family History  Problem Relation Age of Onset  . Heart attack Brother   . Hypertension Mother   . Dementia Mother   . Parkinson's disease Mother   . Hyperlipidemia Mother   . Hypertension Father   . Hyperlipidemia Father   . Hypertension Brother   . Hyperlipidemia Brother   . Diabetes Brother     No Known Allergies  Medication list has been reviewed and updated.  Current Outpatient Prescriptions on File Prior to Visit  Medication Sig Dispense Refill  .  cephALEXin (KEFLEX) 500 MG capsule Take 1 capsule (500 mg total) by mouth 2 (two) times daily. (Patient not taking: Reported on 07/29/2015) 14 capsule 0  . ibuprofen (ADVIL,MOTRIN) 200 MG tablet Take 400 mg by mouth every 6 (six) hours as needed for moderate pain. Reported on 07/29/2015    . ibuprofen (ADVIL,MOTRIN) 800 MG tablet Take 1 tablet (800 mg total) by mouth 3 (three) times daily. (Patient not taking: Reported on 07/29/2015) 21 tablet 0  . methocarbamol (ROBAXIN) 500 MG tablet Take 1 tablet (500 mg total) by mouth 2 (two) times daily. (Patient not taking: Reported on 07/29/2015) 10 tablet 0  . mupirocin ointment (BACTROBAN) 2 % Place 1 application into the nose 2 (two) times daily. (Patient not taking: Reported on 07/29/2015) 22 g 0  . Omega-3 Fatty Acids (FISH OIL) 1000 MG CAPS Take 1,000 mg by mouth daily as needed (takes occasionally). Reported on 07/29/2015     No current facility-administered medications on file prior to visit.    ROS ROS otherwise unremarkable unless listed above.   Physical Examination: BP 118/80 mmHg  Pulse 84  Temp(Src) 98.7 F (37.1 C) (Oral)  Resp 18  Ht 5'  8" (1.727 m)  Wt 306 lb 4 oz (138.914 kg)  BMI 46.58 kg/m2  SpO2 97% Ideal Body Weight: Weight in (lb) to have BMI = 25: 164.1  Physical Exam  Constitutional: She is oriented to person, place, and time. She appears well-developed and well-nourished. No distress.  HENT:  Head: Normocephalic and atraumatic.  Right Ear: Tympanic membrane, external ear and ear canal normal.  Left Ear: Tympanic membrane, external ear and ear canal normal.  Nose: Mucosal edema and rhinorrhea present. Right sinus exhibits no maxillary sinus tenderness and no frontal sinus tenderness. Left sinus exhibits no maxillary sinus tenderness and no frontal sinus tenderness.  Mouth/Throat: No uvula swelling. No oropharyngeal exudate, posterior oropharyngeal edema or posterior oropharyngeal erythema.  Eyes: Conjunctivae and EOM are  normal. Pupils are equal, round, and reactive to light.  Cardiovascular: Normal rate and regular rhythm.  Exam reveals no gallop, no distant heart sounds and no friction rub.   No murmur heard. Pulmonary/Chest: Effort normal. No respiratory distress. She has no decreased breath sounds. She has no wheezes. She has no rhonchi.  Lymphadenopathy:       Head (right side): No submandibular, no tonsillar, no preauricular and no posterior auricular adenopathy present.       Head (left side): No submandibular, no tonsillar, no preauricular and no posterior auricular adenopathy present.  Neurological: She is alert and oriented to person, place, and time.  Skin: She is not diaphoretic.  Psychiatric: She has a normal mood and affect. Her behavior is normal.   Assessment and Plan: [name] is a 40 y.o. female who is here today for cc of sore throat.  --likely allergies but possibly viral.  Advised heavy hydration, vocal rest, and humidifier use.  Seasonal allergies - Plan: cetirizine (ZYRTEC) 10 MG tablet, fluticasone (FLONASE) 50 MCG/ACT nasal spray  Throat pain - Plan: magic mouthwash w/lidocaine SOLN, DISCONTINUED: magic mouthwash w/lidocaine SOLN   Trena Platt, PA-C [facility] 07/29/2015 1:28 PM

## 2015-07-29 NOTE — Patient Instructions (Addendum)
     IF you received an x-ray today, you will receive an invoice from [facility]. Please contact [facility] at [phone] with questions or concerns regarding your invoice.   IF you received labwork today, you will receive an invoice from [facility]. Please contact [facility] at [phone] with questions or concerns regarding your invoice.   Our billing staff will not be able to assist you with questions regarding bills from these companies.  You will be contacted with the lab results as soon as they are available. The fastest way to get your results is to activate your My Chart account. Instructions are located on the last page of this paperwork. If you have not heard from us regarding the results in 2 weeks, please contact this office.     Use the zyrtec daily for 1-2 months, or as long as your allergies are apparent. Remember to hydrate well.  You should be drinking 64 oz of water daily.  You may need to get the dust out of the home, and a purifier.  A humidifier may help as well.   Please use the flonase until the allergies resolve.   You can also opt for cepacol throat lozenges for your throat pain.  Tylenol or ibuprofen is also useful helpful for throat pain.

## 2015-12-05 ENCOUNTER — Encounter: Payer: Self-pay | Admitting: Obstetrics & Gynecology

## 2015-12-05 ENCOUNTER — Ambulatory Visit (INDEPENDENT_AMBULATORY_CARE_PROVIDER_SITE_OTHER): Payer: BLUE CROSS/BLUE SHIELD | Admitting: Obstetrics & Gynecology

## 2015-12-05 VITALS — BP 121/88 | HR 67 | Ht 67.0 in | Wt 306.0 lb

## 2015-12-05 DIAGNOSIS — Z30430 Encounter for insertion of intrauterine contraceptive device: Secondary | ICD-10-CM

## 2015-12-05 NOTE — Progress Notes (Signed)
   Subjective:    Patient ID: Caitlin CancelBarbara Bishop, female    DOB: 1975/11/18, 40 y.o.   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 She would like a pregnancy.    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     Objective:   Physical Exam  Obese BFNAD Breathing, conversing, and ambulating normally Skyla easily removed and noted to be intact.      Assessment & Plan:  Desire for pregnancy-  Rec MVI, weight loss, stop smoking

## 2016-01-02 MED ORDER — LEVONORGESTREL 13.5 MG IU IUD: INTRAUTERINE_SYSTEM | Freq: Once | INTRAUTERINE | Status: AC

## 2016-04-30 ENCOUNTER — Encounter (HOSPITAL_COMMUNITY): Payer: Self-pay

## 2016-04-30 DIAGNOSIS — Z87891 Personal history of nicotine dependence: Secondary | ICD-10-CM | POA: Diagnosis not present

## 2016-04-30 DIAGNOSIS — Z79899 Other long term (current) drug therapy: Secondary | ICD-10-CM | POA: Diagnosis not present

## 2016-04-30 DIAGNOSIS — R112 Nausea with vomiting, unspecified: Secondary | ICD-10-CM | POA: Diagnosis not present

## 2016-04-30 DIAGNOSIS — R52 Pain, unspecified: Secondary | ICD-10-CM | POA: Insufficient documentation

## 2016-04-30 DIAGNOSIS — I10 Essential (primary) hypertension: Secondary | ICD-10-CM | POA: Insufficient documentation

## 2016-04-30 NOTE — ED Triage Notes (Signed)
Pt states she started having a cough with generalized body aches and chills on Friday; pt states pain 10/10 on arrival. Pt a&o x 4 on arrival. Pt did not get the flu shot this season;

## 2016-05-01 ENCOUNTER — Emergency Department (HOSPITAL_COMMUNITY): Payer: BLUE CROSS/BLUE SHIELD

## 2016-05-01 ENCOUNTER — Emergency Department (HOSPITAL_COMMUNITY)
Admission: EM | Admit: 2016-05-01 | Discharge: 2016-05-01 | Disposition: A | Discharge status: Home / Self Care | Payer: BLUE CROSS/BLUE SHIELD | Attending: Emergency Medicine | Admitting: Emergency Medicine

## 2016-05-01 DIAGNOSIS — R112 Nausea with vomiting, unspecified: Secondary | ICD-10-CM

## 2016-05-01 DIAGNOSIS — R6889 Other general symptoms and signs: Secondary | ICD-10-CM

## 2016-05-01 DIAGNOSIS — R197 Diarrhea, unspecified: Secondary | ICD-10-CM

## 2016-05-01 LAB — POC URINE PREG, ED: Preg Test, Ur: NEGATIVE

## 2016-05-01 IMAGING — DX DG CHEST 2V
2 series · 2 of 2 positions shown · non-contrast
Comparison: 07/05/2015 chest radiograph

CLINICAL DATA: 40 y/o F; flu like symptoms. Slight fever and cough.

EXAM:
CHEST  2 VIEW

[chest lat]
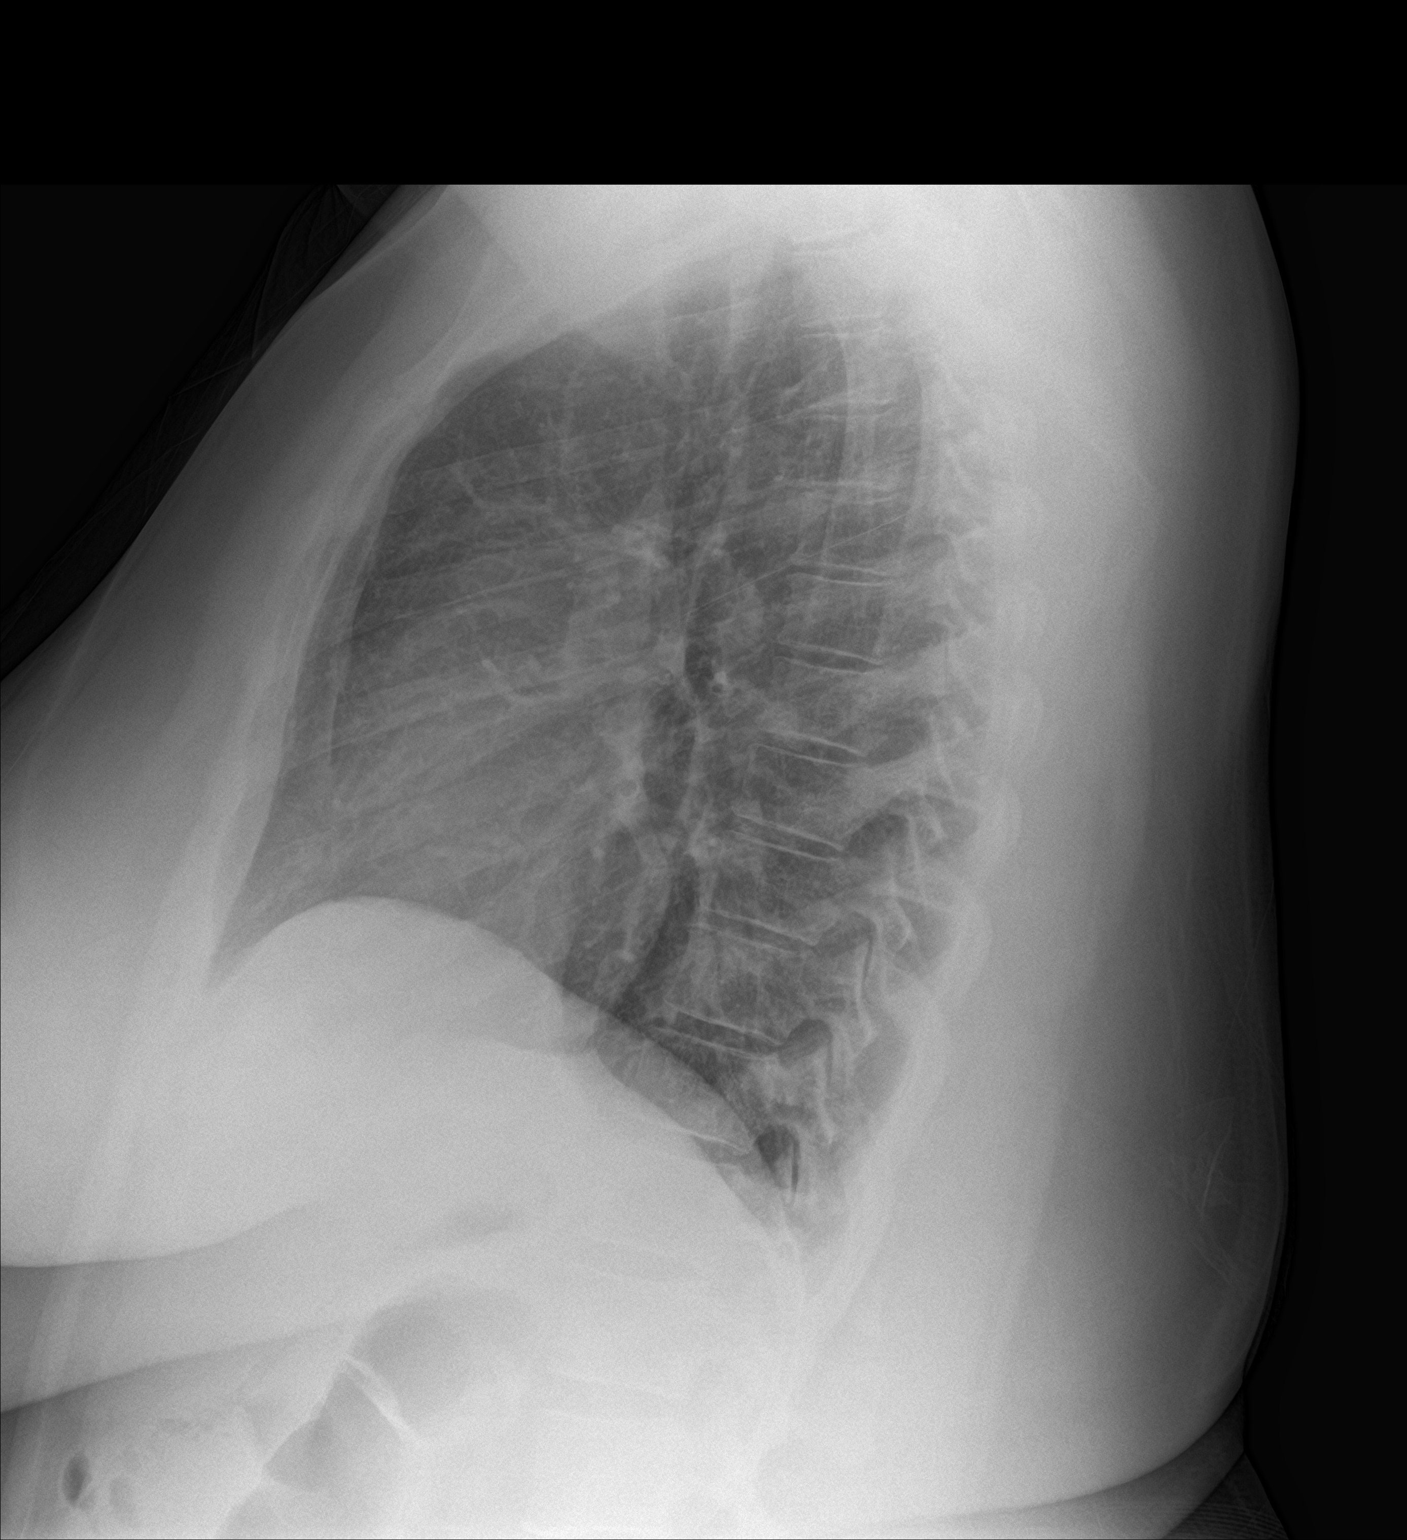

[chest pa]
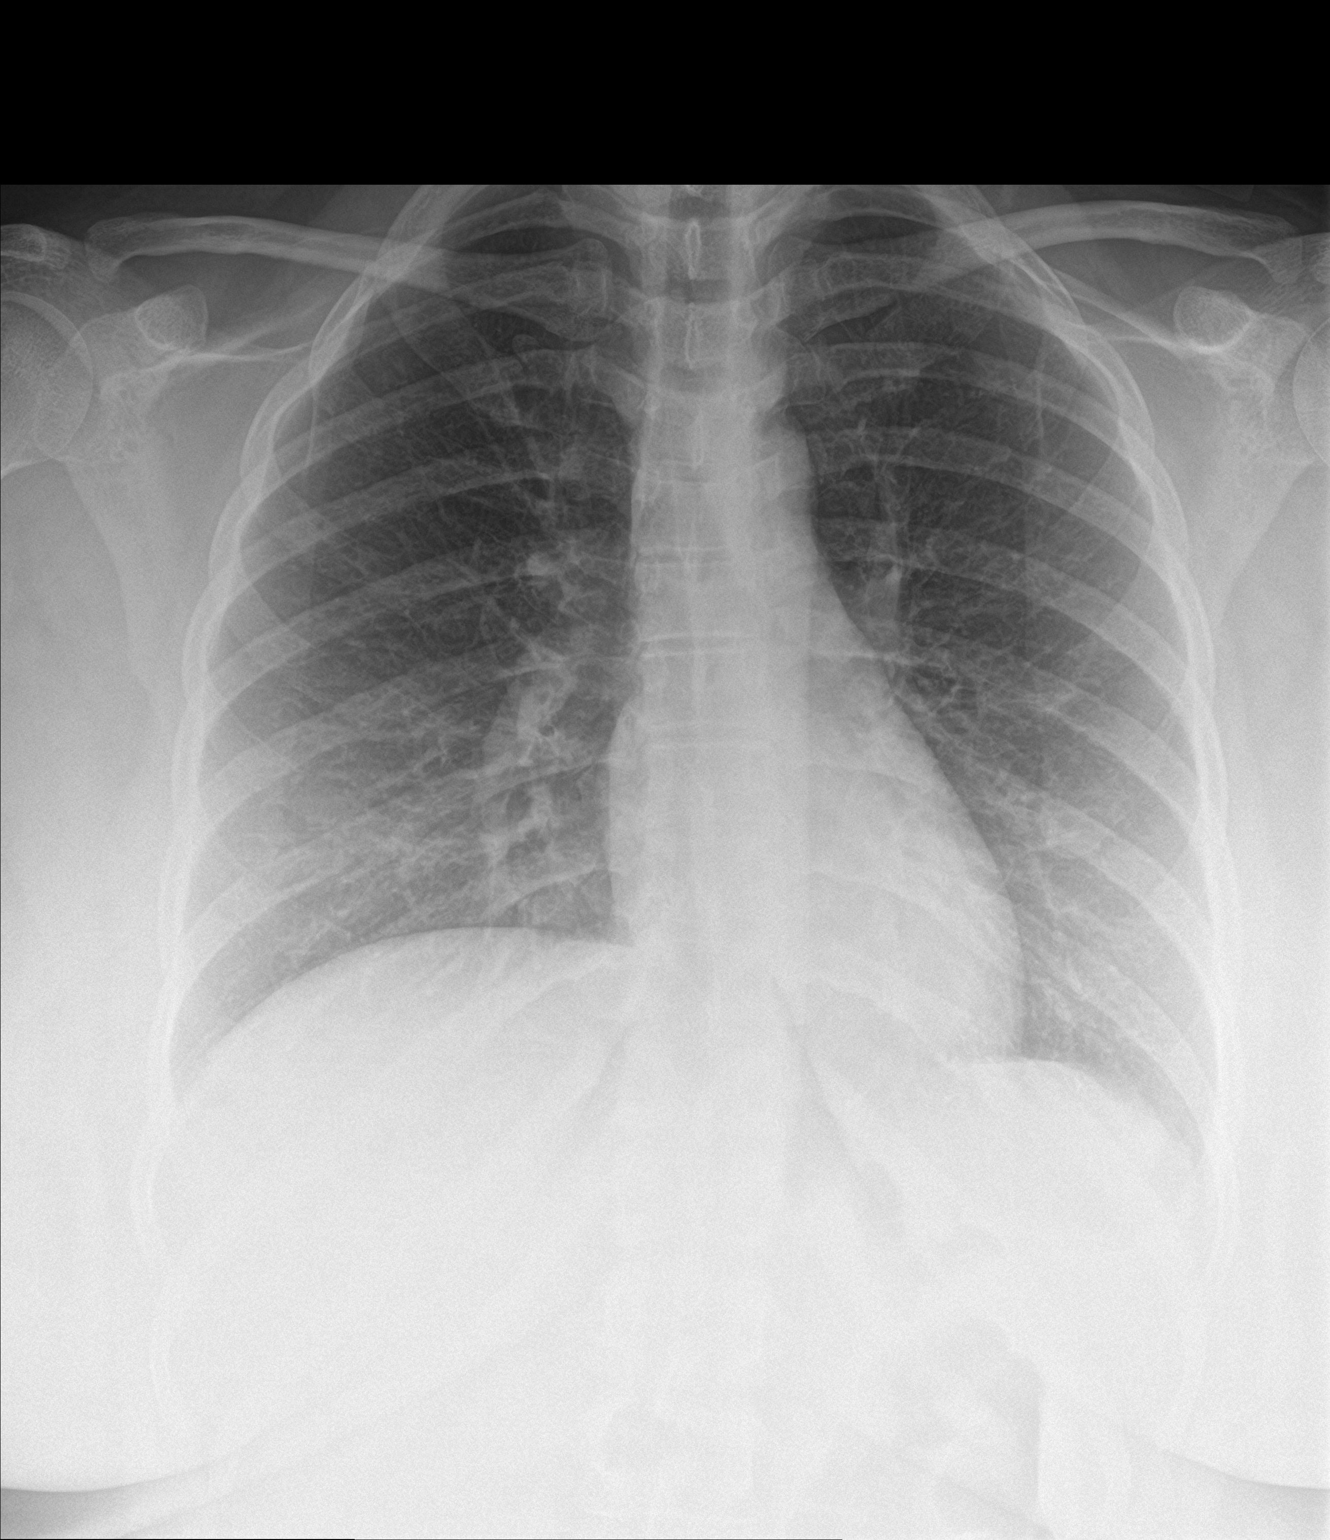

[2 of 2 positions shown; findings below may reference images not displayed]

FINDINGS: The heart size and mediastinal contours are within normal limits and
stable. Both lungs are clear. The visualized skeletal structures are
unremarkable.
IMPRESSION: No active cardiopulmonary disease.

By: Jaylon Aujla M.D.

## 2016-05-01 MED ORDER — LOPERAMIDE HCL 2 MG PO CAPS
2.0000 mg | ORAL_CAPSULE | Freq: Four times a day (QID) | ORAL | 0 refills | Status: AC | PRN
Start: 2016-05-01 — End: ?

## 2016-05-01 MED ORDER — ONDANSETRON 4 MG PO TBDP
4.0000 mg | ORAL_TABLET | Freq: Once | ORAL | Status: AC
  Administered 2016-05-01: 4 mg via ORAL
  Filled 2016-05-01: qty 1

## 2016-05-01 MED ORDER — DICYCLOMINE HCL 10 MG/ML IM SOLN
20.0000 mg | Freq: Once | INTRAMUSCULAR | Status: AC
  Administered 2016-05-01: 20 mg via INTRAMUSCULAR
  Filled 2016-05-01: qty 2

## 2016-05-01 MED ORDER — ONDANSETRON 4 MG PO TBDP: 4.0000 mg | ORAL_TABLET | Freq: Three times a day (TID) | ORAL | 0 refills | Status: AC | PRN

## 2016-05-01 MED ORDER — ACETAMINOPHEN 500 MG PO TABS
1000.0000 mg | ORAL_TABLET | Freq: Once | ORAL | Status: AC
  Administered 2016-05-01: 1000 mg via ORAL
  Filled 2016-05-01: qty 2

## 2016-05-01 MED ORDER — LOPERAMIDE HCL 2 MG PO CAPS
4.0000 mg | ORAL_CAPSULE | ORAL | Status: DC | PRN
  Administered 2016-05-01: 4 mg via ORAL
  Filled 2016-05-01: qty 2

## 2016-05-01 MED ORDER — DICYCLOMINE HCL 20 MG PO TABS: 20.0000 mg | ORAL_TABLET | Freq: Three times a day (TID) | ORAL | 0 refills | Status: AC

## 2016-05-01 NOTE — Discharge Instructions (Signed)
You may alternate between Tylenol 1000 mg every 6 hours as needed for fever and pain and ibuprofen 800 mg every 8 hours as needed for fever and pain. I recommend you take guaifenesin as needed for cough. You may take this medication with dextromethorphan which can help suppress your cough.  You may use over-the-counter nasal saline and Afrin nasal spray as needed for nasal congestion. Please do not use Afrin for more than 3 days in a row.

## 2016-05-01 NOTE — ED Notes (Signed)
Patient verbalized understanding of discharge instructions and denies any further needs or questions at this time. VS stable. Patient ambulatory with steady gait.  

## 2016-05-01 NOTE — ED Notes (Signed)
Patient ambulatory to restroom with steady gait.

## 2016-05-01 NOTE — ED Provider Notes (Signed)
By signing my name below, I, Bridgette Habermann, attest that this documentation has been prepared under the direction and in the presence of Kristen N Ward, DO. Electronically Signed: Bridgette Habermann, ED Scribe. 05/01/16. 1:50 AM.  TIME SEEN: 1:46 AM  CHIEF COMPLAINT:  Chief Complaint  Patient presents with  . Generalized Body Aches  . Chills   HPI:  HPI Comments: Caitlin Bishop is a 41 y.o. female with h/o HTN, Hyperlipidemia, obesity who presents to the Emergency Department complaining of generalized myalgias onset ~1 week ago. Pt also has associated congestion, chills, cough, rhinorrhea, cramping abd pain, nausea, vomiting, and diarrhea. She has taken Delsym today with no relief. Pt did not get a flu shot this season. Pt's LNMP was one week ago, she is sexually active. Pt denies vaginal bleeding/discharge, dysuria, hematuria, or any other associated symptoms.   ROS: See HPI Constitutional:  fever  Eyes: no drainage  ENT: runny nose   Cardiovascular:  no chest pain  Resp: no SOB  GI: vomiting GU: no dysuria Integumentary: no rash  Allergy: no hives  Musculoskeletal: no leg swelling  Neurological: no slurred speech ROS otherwise negative  PAST MEDICAL HISTORY/PAST SURGICAL HISTORY:  Past Medical History:  Diagnosis Date  . Arthritis   . Chlamydia   . Edema of lower extremity   . Gonorrhea   . Hyperlipidemia   . Hypertension   . Morbid obesity (HCC)   . Other and unspecified ovarian cysts     MEDICATIONS:  Prior to Admission medications   Medication Sig Start Date End Date Taking? Authorizing Provider  cephALEXin (KEFLEX) 500 MG capsule Take 1 capsule (500 mg total) by mouth 2 (two) times daily. Patient not taking: Reported on 07/29/2015 04/05/15   Elvina Sidle, MD  cetirizine (ZYRTEC) 10 MG tablet Take 1 tablet (10 mg total) by mouth daily. Patient not taking: Reported on 12/05/2015 07/29/15   Collie Siad English, PA  fluticasone [facility]) 50 MCG/ACT nasal spray Place 2 sprays  into both nostrils daily. Patient not taking: Reported on 12/05/2015 07/29/15   Collie Siad English, PA  ibuprofen (ADVIL,MOTRIN) 200 MG tablet Take 400 mg by mouth every 6 (six) hours as needed for moderate pain. Reported on 07/29/2015    Historical Provider, MD  ibuprofen (ADVIL,MOTRIN) 800 MG tablet Take 1 tablet (800 mg total) by mouth 3 (three) times daily. Patient not taking: Reported on 07/29/2015 06/16/15   [facility] Ward, PA-C  magic mouthwash w/lidocaine SOLN Take 5 mLs by mouth 4 (four) times daily as needed for mouth pain. Patient not taking: Reported on 12/05/2015 07/29/15   Collie Siad English, PA  methocarbamol (ROBAXIN) 500 MG tablet Take 1 tablet (500 mg total) by mouth 2 (two) times daily. Patient not taking: Reported on 07/29/2015 06/16/15   [facility] Ward, PA-C  mupirocin ointment (BACTROBAN) 2 % Place 1 application into the nose 2 (two) times daily. Patient not taking: Reported on 07/29/2015 04/05/15   Elvina Sidle, MD  Omega-3 Fatty Acids (FISH OIL) 1000 MG CAPS Take 1,000 mg by mouth daily as needed (takes occasionally). Reported on 07/29/2015    Historical Provider, MD    ALLERGIES:  No Known Allergies  SOCIAL HISTORY:  Social History  Substance Use Topics  . Smoking status: Former Smoker    Packs/day: 0.50    Years: 13.00    Types: Cigarettes    Quit date: 04/17/2016  . Smokeless tobacco: Never Used     Comment: Now smoking E-cigarettes  . Alcohol use No  Comment: Socially    FAMILY HISTORY: Family History  Problem Relation Age of Onset  . Hypertension Mother   . Dementia Mother   . Parkinson's disease Mother   . Hyperlipidemia Mother   . Hypertension Father   . Hyperlipidemia Father   . Heart attack Brother   . Hypertension Brother   . Hyperlipidemia Brother   . Diabetes Brother     EXAM: BP 99/73   Pulse 107   Temp 99.5 F (37.5 C) (Oral)   Resp 20   Ht 5\' 7"  (1.702 m)   Wt (!) 312 lb (141.5 kg)   LMP 04/24/2016 (Approximate)   SpO2 95%    BMI 48.87 kg/m  CONSTITUTIONAL: Alert and oriented and responds appropriately to questions. Well-appearing; well-nourished, Afebrile and nontoxic HEAD: Normocephalic EYES: Conjunctivae clear, PERRL, EOMI ENT: normal nose; nasal congestion; moist mucous membranes; No pharyngeal erythema or petechiae, no tonsillar hypertrophy or exudate, no uvular deviation, no unilateral swelling, no trismus or drooling, no muffled voice, normal phonation, no stridor, no dental caries present, no drainable dental abscess noted, no Ludwig's angina, tongue sits flat in the bottom of the mouth, no angioedema, no facial erythema or warmth, no facial swelling; no pain with movement of the neck NECK: Supple, no meningismus, no nuchal rigidity, no LAD  CARD: RRR; S1 and S2 appreciated; no murmurs, no clicks, no rubs, no gallops RESP: Normal chest excursion without splinting or tachypnea; breath sounds clear and equal bilaterally; no wheezes, no rhonchi, no rales, no hypoxia or respiratory distress, speaking full sentences ABD/GI: Normal bowel sounds; non-distended; soft, non-tender, no rebound, no guarding, no peritoneal signs, no hepatosplenomegaly, no tenderness at McBurney's point BACK:  The back appears normal and is non-tender to palpation, there is no CVA tenderness EXT: Normal ROM in all joints; non-tender to palpation; no edema; normal capillary refill; no cyanosis, no calf tenderness or swelling    SKIN: Normal color for age and race; warm; no rash NEURO: Moves all extremities equally, sensation to light touch intact diffusely, cranial nerves II through XII intact, normal speech PSYCH: The patient's mood and manner are appropriate. Grooming and personal hygiene are appropriate.  MEDICAL DECISION MAKING: Patient here with likely viral URI and now viral gastroenteritis, possible influenza. Outside treatment window for Tamiflu. She is very well-appearing. Chest x-ray shows no infiltrate, edema. We'll treat  symptomatically with Tylenol, Zofran, Imodium, Bentyl. We'll fluid challenge. We'll check pregnancy test.  Abdominal exam benign. Doubt appendicitis, colitis, bowel obstruction, diverticulitis, cholecystitis. I do not feel she needs emergent abdominal imaging. She appears well hydrated on exam.  Nothing to suggest meningitis.  ED PROGRESS: Urine pregnancy test is negative.  Patient drinking without difficulty. Will discharge home with prescriptions for Zofran, Imodium and Bentyl. Have advised over-the-counter medications for her viral symptoms. Recommend PCP follow-up as needed. Discussed return precautions. Discussed with her that she does not need antibiotics for viral illness.   At this time, I do not feel there is any life-threatening condition present. I have reviewed and discussed all results (EKG, imaging, lab, urine as appropriate) and exam findings with patient/family. I have reviewed nursing notes and appropriate previous records.  I feel the patient is safe to be discharged home without further emergent workup and can continue workup as an outpatient as needed. Discussed usual and customary return precautions. Patient/family verbalize understanding and are comfortable with this plan.  Outpatient follow-up has been provided. All questions have been answered.   I personally performed the services described in  this documentation, which was scribed in my presence. The recorded information has been reviewed and is accurate.     Layla Maw Ward, DO 05/01/16 [phone]

## 2016-05-01 NOTE — ED Notes (Signed)
Patient transported to x-ray. ?

## 2016-08-01 ENCOUNTER — Encounter: Payer: Self-pay | Admitting: Obstetrics & Gynecology

## 2016-08-01 ENCOUNTER — Encounter: Payer: Self-pay | Admitting: *Deleted

## 2016-08-01 ENCOUNTER — Ambulatory Visit (INDEPENDENT_AMBULATORY_CARE_PROVIDER_SITE_OTHER): Payer: BLUE CROSS/BLUE SHIELD | Admitting: Obstetrics & Gynecology

## 2016-08-01 VITALS — BP 134/88 | HR 88 | Ht 67.0 in | Wt 320.0 lb

## 2016-08-01 DIAGNOSIS — Z3202 Encounter for pregnancy test, result negative: Secondary | ICD-10-CM

## 2016-08-01 DIAGNOSIS — Z01812 Encounter for preprocedural laboratory examination: Secondary | ICD-10-CM

## 2016-08-01 DIAGNOSIS — Z30430 Encounter for insertion of intrauterine contraceptive device: Secondary | ICD-10-CM | POA: Diagnosis not present

## 2016-08-01 DIAGNOSIS — Z113 Encounter for screening for infections with a predominantly sexual mode of transmission: Secondary | ICD-10-CM

## 2016-08-01 LAB — POCT URINE PREGNANCY: PREG TEST UR: NEGATIVE

## 2016-08-01 MED ORDER — LEVONORGESTREL 13.5 MG IU IUD
INTRAUTERINE_SYSTEM | Freq: Once | INTRAUTERINE | Status: AC
  Administered 2016-08-01: 17:00:00 via INTRAUTERINE

## 2016-08-01 NOTE — Progress Notes (Signed)
   Subjective:    Patient ID: Caitlin Bishop, female    DOB: May 07, 1975, 41 y.o.   MRN: [id number]  HPI 41yo S AA lady here to get another [place]. She had one in the past but had it removed last year so that she could get pregnant. She and that partner are no longer together. She liked the amenorrhea associated with the [facility].   Review of Systems     Objective:   Physical Exam Pleasant [id number] UPT negative, consent signed, Time out procedure done. Cervix prepped with betadine and grasped with a single tooth tenaculum. [name] was easily placed and the strings were cut to 3-4 cm. Uterus sounded to 8cm. She tolerated the procedure well.      Assessment & Plan:  Contraception- Skyla Rec 2 weeks back up STI testing per patient request

## 2016-08-02 LAB — HEPATITIS B SURFACE ANTIGEN: HEP B S AG: NEGATIVE

## 2016-08-02 LAB — HIV ANTIBODY (ROUTINE TESTING W REFLEX): HIV: NONREACTIVE

## 2016-08-02 LAB — HEPATITIS C ANTIBODY: HCV Ab: NEGATIVE

## 2016-08-02 LAB — RPR

## 2016-08-06 LAB — URINE CYTOLOGY ANCILLARY ONLY
Chlamydia: NEGATIVE
NEISSERIA GONORRHEA: NEGATIVE

## 2017-02-17 IMAGING — DX DG CHEST 2V
2 series · 2 of 2 positions shown · non-contrast
Comparison: None.

CLINICAL DATA: Left-sided chest pain for 1 day

EXAM:
CHEST  2 VIEW

[w chest pa]
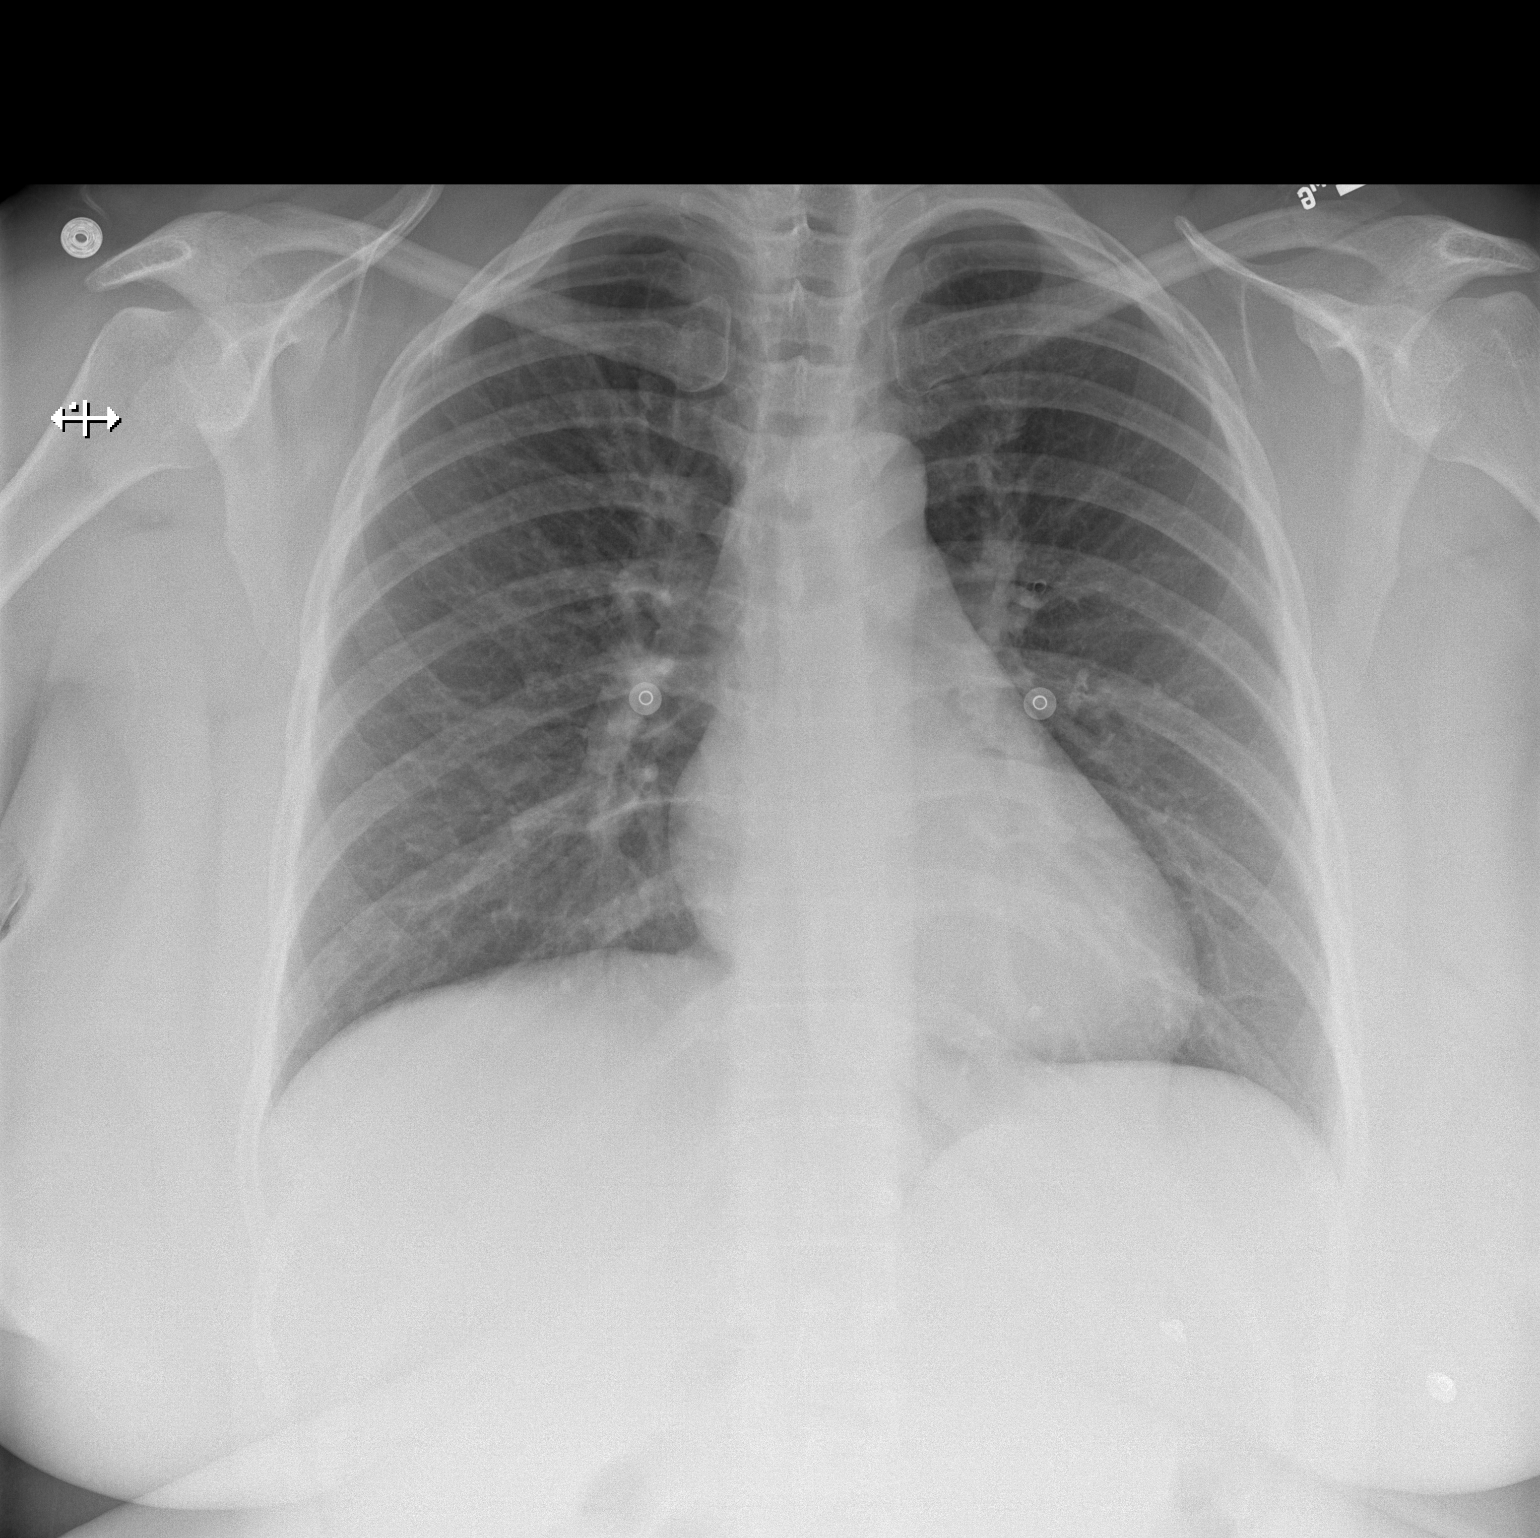

[w chest lat]
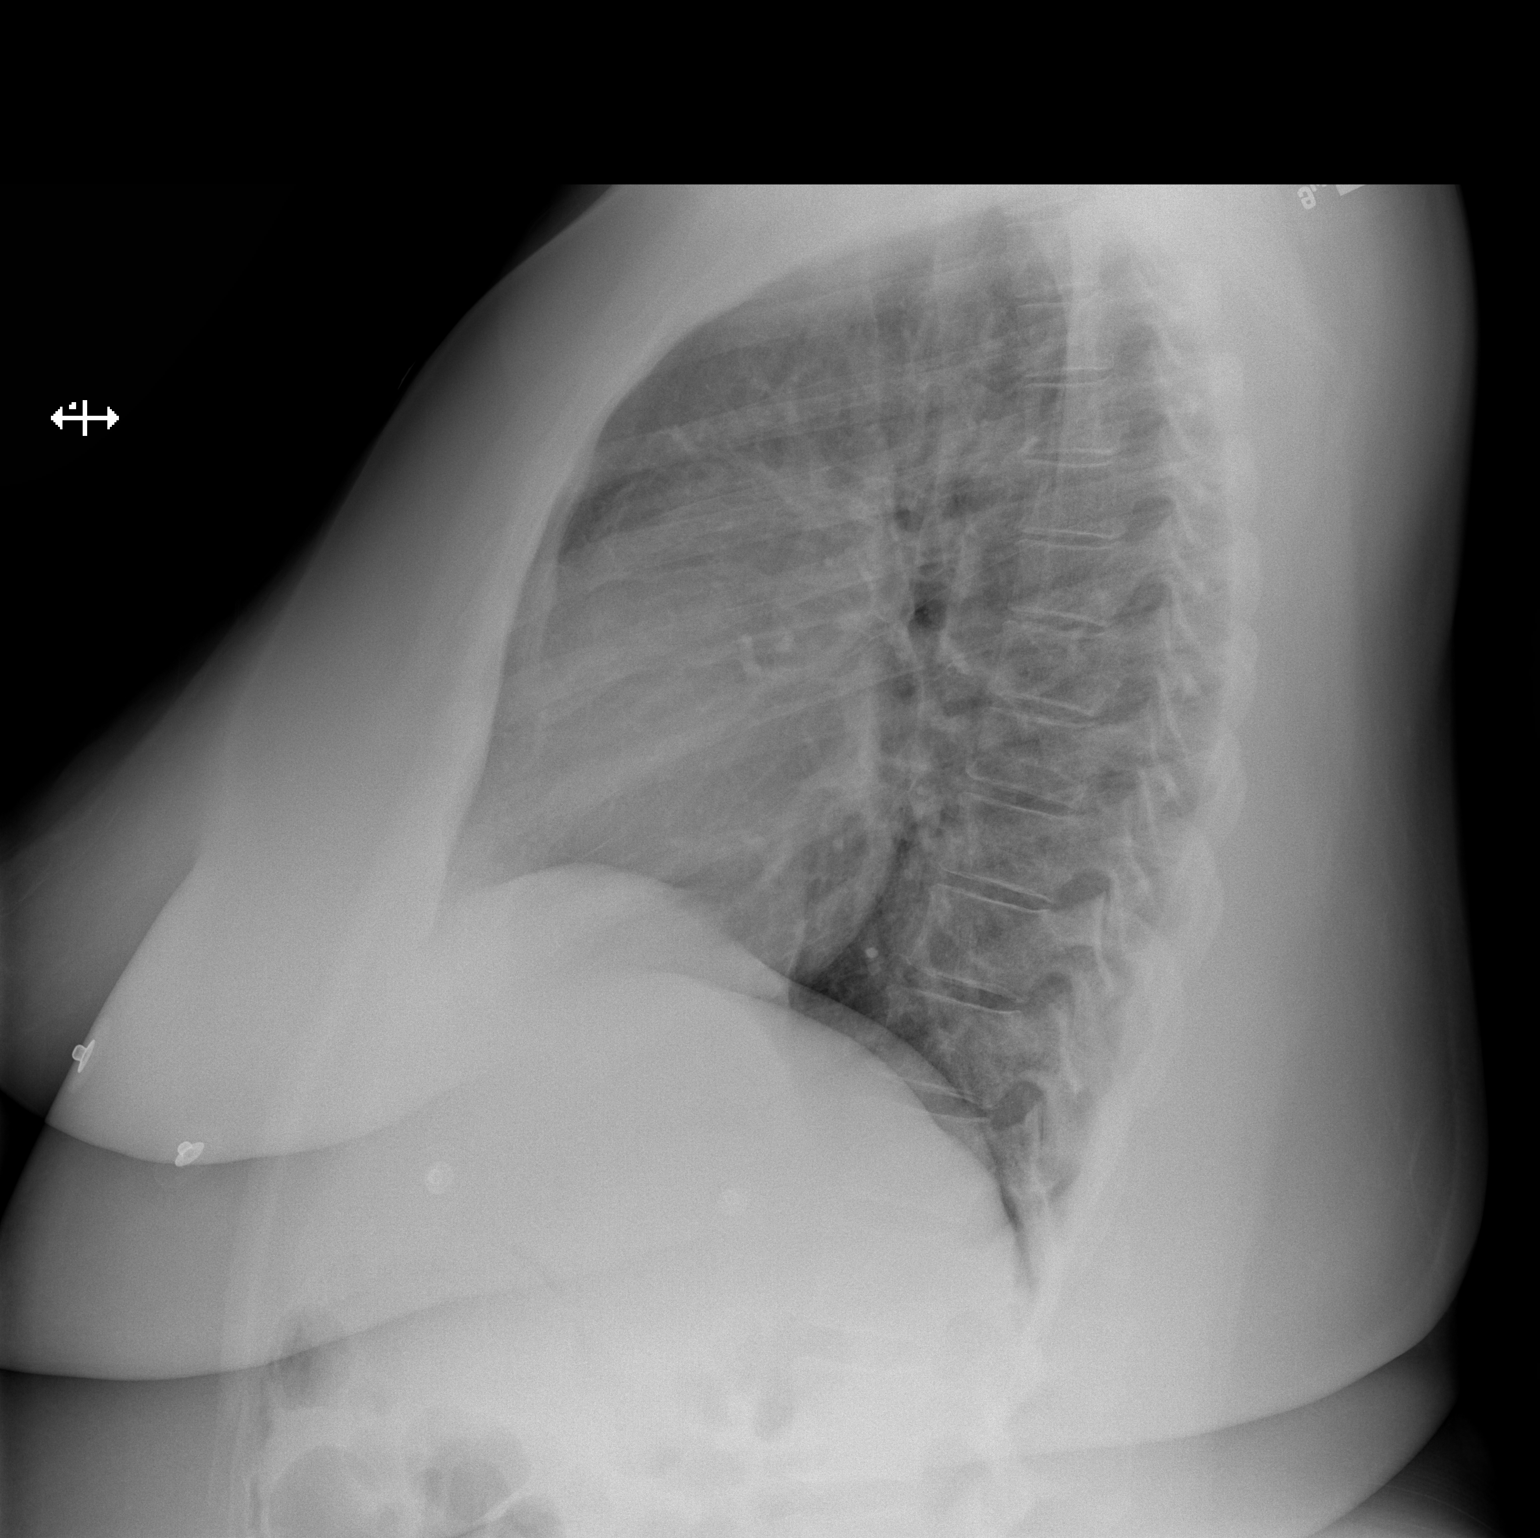

[2 of 2 positions shown; findings below may reference images not displayed]

FINDINGS: Lungs are clear. Heart size and pulmonary vascularity are normal. No
adenopathy. No pneumothorax. No bone lesions.
IMPRESSION: No abnormality noted.

## 2017-08-16 ENCOUNTER — Emergency Department (HOSPITAL_COMMUNITY)
Admission: EM | Admit: 2017-08-16 | Discharge: 2017-08-16 | Disposition: A | Discharge status: Home / Self Care | Payer: BLUE CROSS/BLUE SHIELD | Attending: Emergency Medicine | Admitting: Emergency Medicine

## 2017-08-16 ENCOUNTER — Encounter (HOSPITAL_COMMUNITY): Payer: Self-pay | Admitting: *Deleted

## 2017-08-16 ENCOUNTER — Other Ambulatory Visit: Payer: Self-pay

## 2017-08-16 DIAGNOSIS — L0291 Cutaneous abscess, unspecified: Secondary | ICD-10-CM

## 2017-08-16 DIAGNOSIS — I10 Essential (primary) hypertension: Secondary | ICD-10-CM | POA: Diagnosis not present

## 2017-08-16 DIAGNOSIS — L02214 Cutaneous abscess of groin: Secondary | ICD-10-CM | POA: Insufficient documentation

## 2017-08-16 DIAGNOSIS — F17290 Nicotine dependence, other tobacco product, uncomplicated: Secondary | ICD-10-CM | POA: Insufficient documentation

## 2017-08-16 DIAGNOSIS — R2241 Localized swelling, mass and lump, right lower limb: Secondary | ICD-10-CM | POA: Diagnosis present

## 2017-08-16 MED ORDER — LIDOCAINE-EPINEPHRINE 2 %-1:100000 IJ SOLN
10.0000 mL | Freq: Once | INTRAMUSCULAR | Status: AC
  Administered 2017-08-16: 10 mL via INTRADERMAL
  Filled 2017-08-16: qty 10

## 2017-08-16 MED ORDER — DOXYCYCLINE HYCLATE 50 MG PO CAPS: 50.0000 mg | ORAL_CAPSULE | Freq: Two times a day (BID) | ORAL | 0 refills | Status: AC

## 2017-08-16 NOTE — ED Provider Notes (Cosign Needed)
..  Incision and Drainage Date/Time: 08/16/2017 9:07 PM Performed by: Arthor Captain, PA-C Authorized by: Arthor Captain, PA-C   Consent:    Consent obtained:  Verbal   Consent given by:  Patient   Risks discussed:  Bleeding, incomplete drainage, pain and infection   Alternatives discussed:  No treatment Location:    Type:  Abscess   Size:  5cm   Location:  Lower extremity   Lower extremity location:  Leg   Leg location:  R upper leg Pre-procedure details:    Skin preparation:  Betadine Anesthesia (see MAR for exact dosages):    Anesthesia method:  Local infiltration   Local anesthetic:  Lidocaine 2% WITH epi (6 cc) Procedure details:    Incision types:  Cruciate   Incision depth:  Dermal   Scalpel blade:  11   Wound management:  Probed and deloculated and irrigated with saline   Drainage:  Purulent   Drainage amount:  Copious   Wound treatment:  Wound left open   Packing materials:  1/2 in iodoform gauze Post-procedure details:    Patient tolerance of procedure:  Tolerated well, no immediate complications      Arthor Captain, PA-C 08/17/17 0045

## 2017-08-16 NOTE — ED Triage Notes (Signed)
The pt is c/o pain in her groin since Monday  She has a hx of the same but not for awhile.  No fever  lmp iud

## 2017-08-16 NOTE — ED Provider Notes (Signed)
[facility] EMERGENCY DEPARTMENT Provider Note   CSN: [id number] Arrival date & time: 08/16/17  1616     History   Chief Complaint Chief Complaint  Patient presents with  . Recurrent Skin Infections    HPI Caitlin Bishop is a 42 y.o. female.  HPI   42 year old female presents with concern for right-sided groin swelling and pain beginning on Monday.  Patient reports that area began as a small area of swelling, however is been increasing significantly in time, becoming more painful, and red.  There is been no drainage from it.  Reports that she is put warm compresses over the area without relief.  Reports she had a small abscess she believes in this region, but it resolved spontaneously before, denies any other history of abscesses or abscess drainage.  Denies fever, chills, nausea, vomiting.  Denies any other acute concerns.  Past Medical History:  Diagnosis Date  . Arthritis   . Chlamydia   . Edema of lower extremity   . Gonorrhea   . Hyperlipidemia   . Hypertension   . Morbid obesity (HCC)   . Other and unspecified ovarian cysts     There are no active problems to display for this patient.   Past Surgical History:  Procedure Laterality Date  . BREAST SURGERY    . FOOT SURGERY     removal of sewing  . intestional  Jun 16, 1977   Hole in intestine     OB History    Gravida  1   Para  0   Term  0   Preterm      AB  1   Living  0     SAB  1   TAB      Ectopic      Multiple      Live Births               Home Medications    Prior to Admission medications   Medication Sig Start Date End Date Taking? Authorizing Provider  cephALEXin (KEFLEX) 500 MG capsule Take 1 capsule (500 mg total) by mouth 2 (two) times daily. Patient not taking: Reported on 07/29/2015 04/05/15   Elvina Sidle, MD  cetirizine (ZYRTEC) 10 MG tablet Take 1 tablet (10 mg total) by mouth daily. Patient not taking: Reported on 12/05/2015 07/29/15   Trena Platt D, PA  dicyclomine (BENTYL) 20 MG tablet Take 1 tablet (20 mg total) by mouth 3 (three) times daily before meals. As needed for abdominal cramps Patient not taking: Reported on 08/01/2016 05/01/16   Ward, Layla Maw, DO  doxycycline (VIBRAMYCIN) 50 MG capsule Take 1 capsule (50 mg total) by mouth 2 (two) times daily for 7 days. 08/16/17 08/23/17  Alvira Monday, MD  fluticasone (FLONASE) 50 MCG/ACT nasal spray Place 2 sprays into both nostrils daily. Patient not taking: Reported on 12/05/2015 07/29/15   Trena Platt D, PA  ibuprofen (ADVIL,MOTRIN) 200 MG tablet Take 400 mg by mouth every 6 (six) hours as needed for moderate pain. Reported on 07/29/2015    [provider]  ibuprofen (ADVIL,MOTRIN) 800 MG tablet Take 1 tablet (800 mg total) by mouth 3 (three) times daily. Patient not taking: Reported on 07/29/2015 06/16/15   Ward, Chase Picket, PA-C  loperamide (IMODIUM) 2 MG capsule Take 1 capsule (2 mg total) by mouth 4 (four) times daily as needed for diarrhea or loose stools. Patient not taking: Reported on 08/01/2016 05/01/16   Ward, Layla Maw, DO  magic  mouthwash w/lidocaine SOLN Take 5 mLs by mouth 4 (four) times daily as needed for mouth pain. Patient not taking: Reported on 12/05/2015 07/29/15   Trena Platt D, PA  methocarbamol (ROBAXIN) 500 MG tablet Take 1 tablet (500 mg total) by mouth 2 (two) times daily. Patient not taking: Reported on 07/29/2015 06/16/15   Ward, Chase Picket, PA-C  mupirocin ointment (BACTROBAN) 2 % Place 1 application into the nose 2 (two) times daily. Patient not taking: Reported on 07/29/2015 04/05/15   Elvina Sidle, MD  Omega-3 Fatty Acids (FISH OIL) 1000 MG CAPS Take 1,000 mg by mouth daily as needed (takes occasionally). Reported on 07/29/2015    [provider]  ondansetron (ZOFRAN ODT) 4 MG disintegrating tablet Take 1 tablet (4 mg total) by mouth every 8 (eight) hours as needed for nausea or vomiting. Patient not taking: Reported  on 08/01/2016 05/01/16   Ward, Layla Maw, DO    Family History Family History  Problem Relation Age of Onset  . Hypertension Mother   . Dementia Mother   . Parkinson's disease Mother   . Hyperlipidemia Mother   . Hypertension Father   . Hyperlipidemia Father   . Heart attack Brother   . Hypertension Brother   . Hyperlipidemia Brother   . Diabetes Brother     Social History Social History   Tobacco Use  . Smoking status: Former Smoker    Packs/day: 0.50    Years: 13.00    Pack years: 6.50    Types: Cigarettes    Last attempt to quit: 04/17/2016    Years since quitting: 1.3  . Smokeless tobacco: Never Used  . Tobacco comment: Now smoking E-cigarettes  Substance Use Topics  . Alcohol use: No    Comment: Socially  . Drug use: No     Allergies   Patient has no known allergies.   Review of Systems Review of Systems  Constitutional: Negative for fever.  HENT: Negative for sore throat.   Eyes: Negative for visual disturbance.  Respiratory: Negative for cough and shortness of breath.   Cardiovascular: Negative for chest pain.  Gastrointestinal: Negative for abdominal pain, nausea and vomiting.  Genitourinary: Negative for difficulty urinating.  Musculoskeletal: Negative for back pain and neck pain.  Skin: Positive for rash and wound.  Neurological: Negative for syncope and headaches.     Physical Exam Updated Vital Signs BP 111/83 (BP Location: Right Arm)   Pulse 88   Temp 97.8 F (36.6 C) (Oral)   Resp 16   Ht  (1.702 m)   Wt (!) 140.2 kg (309 lb)   SpO2 99%   BMI 48.40 kg/m   Physical Exam  Constitutional: She is oriented to person, place, and time. She appears well-developed and well-nourished. No distress.  HENT:  Head: Normocephalic and atraumatic.  Eyes: Conjunctivae and EOM are normal.  Neck: Normal range of motion.  Cardiovascular: Normal rate, regular rhythm, normal heart sounds and intact distal pulses.  Pulmonary/Chest: Effort normal. No  respiratory distress.  Musculoskeletal: She exhibits no edema or tenderness.  Neurological: She is alert and oriented to person, place, and time.  Skin: Skin is warm and dry. No rash noted. She is not diaphoretic. There is erythema (and fluctuance right groin).  Nursing note and vitals reviewed.    ED Treatments / Results  Labs (all labs ordered are listed, but only abnormal results are displayed) Labs Reviewed - No data to display  EKG None  Radiology No results found.  Procedures  Procedures (including critical care time)  Medications Ordered in ED Medications  lidocaine-EPINEPHrine (XYLOCAINE W/EPI) 2 %-1:100000 (with pres) injection 10 mL (10 mLs Intradermal Given 08/16/17 2108)     Initial Impression / Assessment and Plan / ED Course  I have reviewed the triage vital signs and the nursing notes.  Pertinent labs & imaging results that were available during my care of the patient were reviewed by me and considered in my medical decision making (see chart for details).     42 year old female presents with concern for right-sided groin swelling and pain beginning on Monday. Exam consistent with abscess. Arthor Captain PA-C performed drainage, please see her note for details. No symptoms of systemic illness. Given rx for doxycyline, recommend ibuprofen/tylenol and wound recheck as desired in 48hr.  Patient discharged in stable condition with understanding of reasons to return.   Final Clinical Impressions(s) / ED Diagnoses   Final diagnoses:  Abscess    ED Discharge Orders        Ordered    doxycycline (VIBRAMYCIN) 50 MG capsule  2 times daily     08/16/17 2050       Alvira Monday, MD 08/17/17 [phone]

## 2017-08-20 ENCOUNTER — Emergency Department (HOSPITAL_COMMUNITY)
Admission: EM | Admit: 2017-08-20 | Discharge: 2017-08-20 | Disposition: A | Discharge status: Home / Self Care | Payer: BLUE CROSS/BLUE SHIELD | Attending: Emergency Medicine | Admitting: Emergency Medicine

## 2017-08-20 ENCOUNTER — Other Ambulatory Visit: Payer: Self-pay

## 2017-08-20 ENCOUNTER — Encounter (HOSPITAL_COMMUNITY): Payer: Self-pay

## 2017-08-20 DIAGNOSIS — S0502XA Injury of conjunctiva and corneal abrasion without foreign body, left eye, initial encounter: Secondary | ICD-10-CM | POA: Diagnosis not present

## 2017-08-20 DIAGNOSIS — E785 Hyperlipidemia, unspecified: Secondary | ICD-10-CM | POA: Diagnosis not present

## 2017-08-20 DIAGNOSIS — Z87891 Personal history of nicotine dependence: Secondary | ICD-10-CM | POA: Diagnosis not present

## 2017-08-20 DIAGNOSIS — Y939 Activity, unspecified: Secondary | ICD-10-CM | POA: Insufficient documentation

## 2017-08-20 DIAGNOSIS — I10 Essential (primary) hypertension: Secondary | ICD-10-CM | POA: Diagnosis not present

## 2017-08-20 DIAGNOSIS — Y929 Unspecified place or not applicable: Secondary | ICD-10-CM | POA: Diagnosis not present

## 2017-08-20 DIAGNOSIS — Z23 Encounter for immunization: Secondary | ICD-10-CM | POA: Insufficient documentation

## 2017-08-20 DIAGNOSIS — Y998 Other external cause status: Secondary | ICD-10-CM | POA: Insufficient documentation

## 2017-08-20 DIAGNOSIS — T2690XA Corrosion of unspecified eye and adnexa, part unspecified, initial encounter: Secondary | ICD-10-CM

## 2017-08-20 DIAGNOSIS — Y33XXXA Other specified events, undetermined intent, initial encounter: Secondary | ICD-10-CM | POA: Diagnosis not present

## 2017-08-20 DIAGNOSIS — Z79899 Other long term (current) drug therapy: Secondary | ICD-10-CM | POA: Diagnosis not present

## 2017-08-20 DIAGNOSIS — S0592XA Unspecified injury of left eye and orbit, initial encounter: Secondary | ICD-10-CM | POA: Diagnosis present

## 2017-08-20 MED ORDER — TETANUS-DIPHTH-ACELL PERTUSSIS 5-2.5-18.5 LF-MCG/0.5 IM SUSP
0.5000 mL | Freq: Once | INTRAMUSCULAR | Status: AC
  Administered 2017-08-20: 0.5 mL via INTRAMUSCULAR
  Filled 2017-08-20: qty 0.5

## 2017-08-20 MED ORDER — TETRACAINE HCL 0.5 % OP SOLN
2.0000 [drp] | Freq: Once | OPHTHALMIC | Status: AC
  Administered 2017-08-20: 2 [drp] via OPHTHALMIC
  Filled 2017-08-20: qty 4

## 2017-08-20 MED ORDER — FLUORESCEIN SODIUM 1 MG OP STRP
1.0000 | ORAL_STRIP | Freq: Once | OPHTHALMIC | Status: AC
  Administered 2017-08-20: 1 via OPHTHALMIC
  Filled 2017-08-20: qty 1

## 2017-08-20 MED ORDER — IBUPROFEN 400 MG PO TABS
600.0000 mg | ORAL_TABLET | Freq: Once | ORAL | Status: AC
Start: 2017-08-20 — End: 2017-08-20
  Administered 2017-08-20: 11:00:00 600 mg via ORAL
  Filled 2017-08-20: qty 1

## 2017-08-20 MED ORDER — TOBRAMYCIN 0.3 % OP SOLN
2.0000 [drp] | Freq: Once | OPHTHALMIC | Status: AC
  Administered 2017-08-20: 2 [drp] via OPHTHALMIC
  Filled 2017-08-20: qty 5

## 2017-08-20 NOTE — ED Provider Notes (Signed)
[facility] EMERGENCY DEPARTMENT Provider Note   CSN: [id number] Arrival date & time: 08/20/17  0720     History   Chief Complaint Chief Complaint  Patient presents with  . eye irritation    HPI Caitlin Bishop is a 42 y.o. female with a past medical history of morbid obesity, who presents today for evaluation of eye pain.  She reports that yesterday when she woke up she realized she had accidentally soaked her contact lenses in a chlorhexidine based solution instead of contact lens solution.  She Reports that she washed the lenses off and wore them yesterday but last night they started burning, left one is hurting and swollen, right one is not.  No blurred vision when compared to her normal uncorrected.  She is unsure when her last Tdap was.      HPI  Past Medical History:  Diagnosis Date  . Arthritis   . Chlamydia   . Edema of lower extremity   . Gonorrhea   . Hyperlipidemia   . Hypertension   . Morbid obesity (HCC)   . Other and unspecified ovarian cysts     There are no active problems to display for this patient.   Past Surgical History:  Procedure Laterality Date  . BREAST SURGERY    . FOOT SURGERY     removal of sewing  . intestional  04/25/77   Hole in intestine     OB History    Gravida  1   Para  0   Term  0   Preterm      AB  1   Living  0     SAB  1   TAB      Ectopic      Multiple      Live Births               Home Medications    Prior to Admission medications   Medication Sig Start Date End Date Taking? Authorizing Provider  cephALEXin (KEFLEX) 500 MG capsule Take 1 capsule (500 mg total) by mouth 2 (two) times daily. Patient not taking: Reported on 07/29/2015 04/05/15   Elvina Sidle, MD  cetirizine (ZYRTEC) 10 MG tablet Take 1 tablet (10 mg total) by mouth daily. Patient not taking: Reported on 12/05/2015 07/29/15   Trena Platt D, PA  dicyclomine (BENTYL) 20 MG tablet Take 1 tablet (20 mg  total) by mouth 3 (three) times daily before meals. As needed for abdominal cramps Patient not taking: Reported on 08/01/2016 05/01/16   Ward, Layla Maw, DO  doxycycline (VIBRAMYCIN) 50 MG capsule Take 1 capsule (50 mg total) by mouth 2 (two) times daily for 7 days. 08/16/17 08/23/17  Alvira Monday, MD  fluticasone (FLONASE) 50 MCG/ACT nasal spray Place 2 sprays into both nostrils daily. Patient not taking: Reported on 12/05/2015 07/29/15   Trena Platt D, PA  ibuprofen (ADVIL,MOTRIN) 200 MG tablet Take 400 mg by mouth every 6 (six) hours as needed for moderate pain. Reported on 07/29/2015    [provider]  ibuprofen (ADVIL,MOTRIN) 800 MG tablet Take 1 tablet (800 mg total) by mouth 3 (three) times daily. Patient not taking: Reported on 07/29/2015 06/16/15   Ward, Chase Picket, PA-C  loperamide (IMODIUM) 2 MG capsule Take 1 capsule (2 mg total) by mouth 4 (four) times daily as needed for diarrhea or loose stools. Patient not taking: Reported on 08/01/2016 05/01/16   Ward, Layla Maw, DO  magic mouthwash w/lidocaine SOLN Take  5 mLs by mouth 4 (four) times daily as needed for mouth pain. Patient not taking: Reported on 12/05/2015 07/29/15   Trena Platt D, PA  methocarbamol (ROBAXIN) 500 MG tablet Take 1 tablet (500 mg total) by mouth 2 (two) times daily. Patient not taking: Reported on 07/29/2015 06/16/15   Ward, Chase Picket, PA-C  mupirocin ointment (BACTROBAN) 2 % Place 1 application into the nose 2 (two) times daily. Patient not taking: Reported on 07/29/2015 04/05/15   Elvina Sidle, MD  Omega-3 Fatty Acids (FISH OIL) 1000 MG CAPS Take 1,000 mg by mouth daily as needed (takes occasionally). Reported on 07/29/2015    [provider]  ondansetron (ZOFRAN ODT) 4 MG disintegrating tablet Take 1 tablet (4 mg total) by mouth every 8 (eight) hours as needed for nausea or vomiting. Patient not taking: Reported on 08/01/2016 05/01/16   Ward, Layla Maw, DO    Family History Family  History  Problem Relation Age of Onset  . Hypertension Mother   . Dementia Mother   . Parkinson's disease Mother   . Hyperlipidemia Mother   . Hypertension Father   . Hyperlipidemia Father   . Heart attack Brother   . Hypertension Brother   . Hyperlipidemia Brother   . Diabetes Brother     Social History Social History   Tobacco Use  . Smoking status: Former Smoker    Packs/day: 0.50    Years: 13.00    Pack years: 6.50    Types: Cigarettes    Last attempt to quit: 04/17/2016    Years since quitting: 1.3  . Smokeless tobacco: Never Used  . Tobacco comment: Now smoking E-cigarettes  Substance Use Topics  . Alcohol use: No    Comment: Socially  . Drug use: No     Allergies   Patient has no known allergies.   Review of Systems Review of Systems  Constitutional: Negative for chills and fever.  Eyes: Positive for photophobia, pain, discharge and redness. Negative for visual disturbance.  Neurological: Negative for headaches.  All other systems reviewed and are negative.    Physical Exam Updated Vital Signs BP (!) 144/80 (BP Location: Right Arm)   Pulse 90   Temp 98.2 F (36.8 C) (Oral)   Resp 20   SpO2 99%   Physical Exam  Constitutional: She appears well-developed and well-nourished. No distress.  HENT:  Head: Normocephalic and atraumatic.  Eyes: Pupils are equal, round, and reactive to light. EOM are normal. Right eye exhibits no chemosis. Left eye exhibits chemosis. Right conjunctiva is injected. Left conjunctiva is injected (Left more than right).  Slit lamp exam:      The right eye shows no corneal flare, no corneal ulcer and no fluorescein uptake.       The left eye shows corneal ulcer and fluorescein uptake. The left eye shows no corneal flare.  Neck: Normal range of motion.  Cardiovascular: Normal rate and regular rhythm.  Pulmonary/Chest: Effort normal. No stridor. No respiratory distress.  Abdominal: She exhibits no distension.  Musculoskeletal:  She exhibits no edema or deformity.  Neurological: She is alert. She exhibits normal muscle tone.  Skin: Skin is warm and dry. She is not diaphoretic.  Psychiatric: She has a normal mood and affect. Her behavior is normal.  Nursing note and vitals reviewed.     ED Treatments / Results  Labs (all labs ordered are listed, but only abnormal results are displayed) Labs Reviewed - No data to display  EKG None  Radiology  No results found.  Procedures Procedures (including critical care time)  Medications Ordered in ED Medications  tobramycin (TOBREX) 0.3 % ophthalmic solution 2 drop (has no administration in time range)  Tdap (BOOSTRIX) injection 0.5 mL (0.5 mLs Intramuscular Given 08/20/17 1057)  ibuprofen (ADVIL,MOTRIN) tablet 600 mg (600 mg Oral Given 08/20/17 1056)  fluorescein ophthalmic strip 1 strip (1 strip Left Eye Given 08/20/17 1056)  tetracaine (PONTOCAINE) 0.5 % ophthalmic solution 2 drop (2 drops Left Eye Given 08/20/17 1056)     Initial Impression / Assessment and Plan / ED Course  I have reviewed the triage vital signs and the nursing notes.  Pertinent labs & imaging results that were available during my care of the patient were reviewed by me and considered in my medical decision making (see chart for details).  Clinical Course as of Aug 21 1307  Wed Aug 20, 2017  1030 [facility] antiseptic skin cleanser, chlorhexidine 4%    [EH]  1115 Spoke with Dr. Genia Del from ophthalmology who recommended irrigation, antibiotic eyedrops, and that if she has a large corneal abrasion to send her to his office today, otherwise follow-up in 1 to 2 days.   [EH]    Clinical Course User Index [EH] Cristina Gong, PA-C   Patient presents today for evaluation of eye pain.  She accidentally soaked her contact lenses in chx based solution instead of saline solution.  Left eye is red, painful.  Left eye irrigated with morgan lens and 1 liter of LR which improved patient pain.  Eye was  numbed and florescein staining was performed bilaterally, no uptake on left eye, diffuse hazy abrasion on left eye from 8-10 with multiple pinpoint areas consistent with diffuse chemical burn.  Ophthalmology was consulted who recommended antibiotic eye drops, outpatient follow up.  Patient given tobramycin eye drops.  Return precautions discussed.  Patient states understanding.  Patient discharged home.    Final Clinical Impressions(s) / ED Diagnoses   Final diagnoses:  Chemical burn of eye  Abrasion of left cornea, initial encounter    ED Discharge Orders    None       Norman Clay 08/20/17 1312    Raeford Razor, MD 08/22/17 [phone]

## 2017-08-20 NOTE — Discharge Instructions (Addendum)
Please put 2 drops into each eye 4 times a day for 5 days.  Please throw out your contacts.  Do not use any contacts until you are cleared to do so by an eye doctor.  It is important that you follow-up with the eye doctor, failure to do so may result in continued pain, prolonged healing, or permanent vision loss or change.  Please take Ibuprofen (Advil, motrin) and Tylenol (acetaminophen) to relieve your pain.  You may take up to 600 MG (3 pills) of normal strength ibuprofen every 8 hours as needed.  In between doses of ibuprofen you make take tylenol, up to 1,000 mg (two extra strength pills).  Do not take more than 3,000 mg tylenol in a 24 hour period.  Please check all medication labels as many medications such as pain and cold medications may contain tylenol.  Do not drink alcohol while taking these medications.  Do not take other NSAID'S while taking ibuprofen (such as aleve or naproxen).  Please take ibuprofen with food to decrease stomach upset.

## 2017-08-20 NOTE — ED Notes (Signed)
ED Provider at bedside. 

## 2017-08-20 NOTE — ED Notes (Signed)
Pt verbalized understanding of discharge instructions and denies any further questions at this time.   

## 2017-08-20 NOTE — ED Triage Notes (Signed)
Patient complains of left irritation and drainage after getting soap in eye while changing contacts. Watering on arrival, complains of pain to same. Feels scratchy

## 2017-08-20 NOTE — ED Notes (Signed)
Patient usually wears contact lens. Unable to wear them at this time.    [employer] Lamp and Tonopen at bedside if needed.

## 2017-08-20 NOTE — ED Notes (Signed)
Morgans lens placed on pt. Pt comfortable.

## 2017-08-20 NOTE — ED Notes (Signed)
ED Provider at bedside.
